# Patient Record
Sex: Male | Born: 1965 | Race: White | Hispanic: No | Marital: Married | Smoking: Never smoker
Health system: Southern US, Community
[De-identification: ages and names within clinical notes are randomized; demographics above are authoritative.]

## PROBLEM LIST (undated history)

## (undated) DIAGNOSIS — E041 Nontoxic single thyroid nodule: Secondary | ICD-10-CM

## (undated) DIAGNOSIS — F329 Major depressive disorder, single episode, unspecified: Secondary | ICD-10-CM

## (undated) DIAGNOSIS — I10 Essential (primary) hypertension: Secondary | ICD-10-CM

## (undated) DIAGNOSIS — I7090 Unspecified atherosclerosis: Secondary | ICD-10-CM

## (undated) DIAGNOSIS — F32A Depression, unspecified: Secondary | ICD-10-CM

## (undated) DIAGNOSIS — C801 Malignant (primary) neoplasm, unspecified: Secondary | ICD-10-CM

## (undated) DIAGNOSIS — J329 Chronic sinusitis, unspecified: Secondary | ICD-10-CM

## (undated) DIAGNOSIS — G4730 Sleep apnea, unspecified: Secondary | ICD-10-CM

## (undated) DIAGNOSIS — C3490 Malignant neoplasm of unspecified part of unspecified bronchus or lung: Secondary | ICD-10-CM

## (undated) DIAGNOSIS — Z8709 Personal history of other diseases of the respiratory system: Secondary | ICD-10-CM

## (undated) DIAGNOSIS — Z8739 Personal history of other diseases of the musculoskeletal system and connective tissue: Secondary | ICD-10-CM

## (undated) DIAGNOSIS — R0602 Shortness of breath: Secondary | ICD-10-CM

## (undated) DIAGNOSIS — E785 Hyperlipidemia, unspecified: Secondary | ICD-10-CM

## (undated) DIAGNOSIS — K219 Gastro-esophageal reflux disease without esophagitis: Secondary | ICD-10-CM

## (undated) DIAGNOSIS — I2699 Other pulmonary embolism without acute cor pulmonale: Secondary | ICD-10-CM

## (undated) DIAGNOSIS — K635 Polyp of colon: Secondary | ICD-10-CM

## (undated) HISTORY — DX: Hyperlipidemia, unspecified: E78.5

## (undated) HISTORY — PX: NASAL SEPTUM SURGERY: SHX37

## (undated) HISTORY — DX: Nontoxic single thyroid nodule: E04.1

## (undated) HISTORY — PX: ESOPHAGOGASTRODUODENOSCOPY: SHX1529

## (undated) HISTORY — PX: COLONOSCOPY: SHX174

## (undated) HISTORY — DX: Polyp of colon: K63.5

## (undated) HISTORY — DX: Sleep apnea, unspecified: G47.30

---
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
## 2004-06-30 ENCOUNTER — Ambulatory Visit: Payer: Self-pay | Admitting: Internal Medicine

## 2005-01-13 ENCOUNTER — Ambulatory Visit: Payer: Self-pay | Admitting: Unknown Physician Specialty

## 2005-12-23 ENCOUNTER — Ambulatory Visit: Payer: Self-pay | Admitting: Unknown Physician Specialty

## 2006-03-18 ENCOUNTER — Ambulatory Visit: Payer: Self-pay | Admitting: Otolaryngology

## 2006-06-21 ENCOUNTER — Ambulatory Visit: Payer: Self-pay | Admitting: Otolaryngology

## 2006-07-20 ENCOUNTER — Ambulatory Visit: Payer: Self-pay | Admitting: Gastroenterology

## 2007-05-28 ENCOUNTER — Emergency Department: Payer: Self-pay | Admitting: Emergency Medicine

## 2007-05-28 ENCOUNTER — Other Ambulatory Visit: Payer: Self-pay

## 2007-06-07 ENCOUNTER — Ambulatory Visit: Payer: Self-pay | Admitting: Cardiology

## 2007-11-19 HISTORY — PX: CARDIAC CATHETERIZATION: SHX172

## 2008-08-31 ENCOUNTER — Ambulatory Visit: Payer: Self-pay | Admitting: Family Medicine

## 2009-11-12 ENCOUNTER — Ambulatory Visit: Payer: Self-pay | Admitting: Gastroenterology

## 2009-12-10 ENCOUNTER — Ambulatory Visit: Payer: Self-pay | Admitting: Family Medicine

## 2010-04-20 HISTORY — PX: MENISCUS REPAIR: SHX5179

## 2010-07-29 ENCOUNTER — Ambulatory Visit: Payer: Self-pay | Admitting: Sports Medicine

## 2011-02-24 ENCOUNTER — Ambulatory Visit: Payer: Self-pay | Admitting: Otolaryngology

## 2011-06-16 ENCOUNTER — Ambulatory Visit: Payer: Self-pay | Admitting: Otolaryngology

## 2011-10-28 ENCOUNTER — Ambulatory Visit: Payer: Self-pay | Admitting: Internal Medicine

## 2011-10-29 ENCOUNTER — Ambulatory Visit (INDEPENDENT_AMBULATORY_CARE_PROVIDER_SITE_OTHER): Payer: BC Managed Care – PPO | Admitting: Internal Medicine

## 2011-10-29 ENCOUNTER — Encounter: Payer: Self-pay | Admitting: Internal Medicine

## 2011-10-29 VITALS — BP 122/82 | HR 56 | Temp 98.7°F | Ht 73.0 in | Wt 237.5 lb

## 2011-10-29 DIAGNOSIS — E669 Obesity, unspecified: Secondary | ICD-10-CM | POA: Insufficient documentation

## 2011-10-29 DIAGNOSIS — A6000 Herpesviral infection of urogenital system, unspecified: Secondary | ICD-10-CM | POA: Insufficient documentation

## 2011-10-29 DIAGNOSIS — E785 Hyperlipidemia, unspecified: Secondary | ICD-10-CM | POA: Insufficient documentation

## 2011-10-29 DIAGNOSIS — Z0000 Encounter for general adult medical examination without abnormal findings: Secondary | ICD-10-CM

## 2011-10-29 MED ORDER — VALACYCLOVIR HCL 1 G PO TABS: 1000.0000 mg | ORAL_TABLET | Freq: Two times a day (BID) | ORAL | Status: DC

## 2011-10-29 NOTE — Assessment & Plan Note (Signed)
Will check lipids and LFTs with labs today. Continue Crestor. 

## 2011-10-29 NOTE — Progress Notes (Signed)
Subjective:    Patient ID: Robert Morse, male    DOB: Dec 09, 1965, 46 y.o.   MRN: [id number]  HPI 46 year old male with history of hyperlipidemia presents to establish care. He reports that he is generally feeling well. He notes that he is compliant with his medications. He recently has adopted a very healthy diet and has been eating foods which are high in lean protein and low in processed carbohydrates. He has been eating 5 meals per day. He is also been exercising regularly, doing cardio 30 minutes 5 days a week and working with a trainer 2-3 times per week. He notes he has lost 10 pounds in the last 3 weeks.  He notes a history of genital herpes. He was first diagnosed with this approximately 5 years ago after his wife was diagnosed with genital herpes. He occasionally takes Valtrex for an outbreak. He estimates this occurs approximately 3 times per year. He has not had a recent outbreak.  Outpatient Encounter Prescriptions as of 10/29/2011  Medication Sig Dispense Refill  . aspirin 81 MG tablet Take 81 mg by mouth daily.      . citalopram (CELEXA) 20 MG tablet Take 20 mg by mouth daily.      . lansoprazole (PREVACID) 30 MG capsule Take 30 mg by mouth daily.      . Multiple Vitamins-Minerals (CENTRUM PO) Take 1 capsule by mouth daily.      . rosuvastatin (CRESTOR) 10 MG tablet Take 10 mg by mouth daily.      . valACYclovir (VALTREX) 1000 MG tablet Take 1 tablet (1,000 mg total) by mouth 2 (two) times daily.  20 tablet  3    Review of Systems  Constitutional: Negative for fever, chills, activity change, appetite change, fatigue and unexpected weight change.  Eyes: Negative for visual disturbance.  Respiratory: Negative for cough and shortness of breath.   Cardiovascular: Negative for chest pain, palpitations and leg swelling.  Gastrointestinal: Negative for abdominal pain and abdominal distention.  Genitourinary: Negative for dysuria, urgency and difficulty urinating.  Musculoskeletal:  Negative for arthralgias and gait problem.  Skin: Negative for color change and rash.  Hematological: Negative for adenopathy.  Psychiatric/Behavioral: Negative for disturbed wake/sleep cycle and dysphoric mood. The patient is not nervous/anxious.        Objective:   Physical Exam  Constitutional: He is oriented to person, place, and time. He appears well-developed and well-nourished. No distress.  HENT:  Head: Normocephalic and atraumatic.  Right Ear: External ear normal.  Left Ear: External ear normal.  Nose: Nose normal.  Mouth/Throat: Oropharynx is clear and moist. No oropharyngeal exudate.  Eyes: Conjunctivae and EOM are normal. Pupils are equal, round, and reactive to light. Right eye exhibits no discharge. Left eye exhibits no discharge. No scleral icterus.  Neck: Normal range of motion. Neck supple. No tracheal deviation present. No thyromegaly present.  Cardiovascular: Normal rate, regular rhythm and normal heart sounds.  Exam reveals no gallop and no friction rub.   No murmur heard. Pulmonary/Chest: Effort normal and breath sounds normal. No respiratory distress. He has no wheezes. He has no rales. He exhibits no tenderness.  Abdominal: Soft. Bowel sounds are normal. He exhibits no distension and no mass. There is no tenderness. There is no guarding.  Musculoskeletal: Normal range of motion. He exhibits no edema.  Lymphadenopathy:    He has no cervical adenopathy.  Neurological: He is alert and oriented to person, place, and time. No cranial nerve deficit. Coordination normal.  Skin: Skin  is warm and dry. No rash noted. He is not diaphoretic. No erythema. No pallor.  Psychiatric: He has a normal mood and affect. His behavior is normal. Judgment and thought content normal.          Assessment & Plan:

## 2011-10-29 NOTE — Assessment & Plan Note (Signed)
Will use Valtrex as needed for outbreaks.

## 2011-10-29 NOTE — Assessment & Plan Note (Addendum)
BMI 31. Encourage continued efforts at healthy diet and regular physical activity. Encouraged goal of 5 hours of exercise per week.

## 2011-10-30 LAB — COMPREHENSIVE METABOLIC PANEL
Alkaline Phosphatase: 43 U/L (ref 39–117)
Creatinine, Ser: 1 mg/dL (ref 0.4–1.5)
Glucose, Bld: 87 mg/dL (ref 70–99)
Sodium: 140 mEq/L (ref 135–145)
Total Bilirubin: 0.6 mg/dL (ref 0.3–1.2)
Total Protein: 7.5 g/dL (ref 6.0–8.3)

## 2011-10-30 LAB — LIPID PANEL
Cholesterol: 157 mg/dL (ref 0–200)
HDL: 55.7 mg/dL (ref >39.00)
LDL Cholesterol: 85 mg/dL (ref 0–99)
Triglycerides: 82 mg/dL (ref 0.0–149.0)
VLDL: 16.4 mg/dL (ref 0.0–40.0)

## 2011-10-30 LAB — CBC WITH DIFFERENTIAL/PLATELET
Basophils Absolute: 0.1 10*3/uL (ref 0.0–0.1)
Eosinophils Absolute: 0.5 10*3/uL (ref 0.0–0.7)
Lymphocytes Relative: 29.6 % (ref 12.0–46.0)
MCHC: 33.8 g/dL (ref 30.0–36.0)
MCV: 90.3 fl (ref 78.0–100.0)
Monocytes Absolute: 0.4 10*3/uL (ref 0.1–1.0)
Neutrophils Relative %: 52.9 % (ref 43.0–77.0)
Platelets: 263 10*3/uL (ref 150.0–400.0)
RBC: 4.75 Mil/uL (ref 4.22–5.81)
RDW: 12.8 % (ref 11.5–14.6)

## 2011-12-18 ENCOUNTER — Encounter: Payer: Self-pay | Admitting: Internal Medicine

## 2011-12-18 ENCOUNTER — Ambulatory Visit (INDEPENDENT_AMBULATORY_CARE_PROVIDER_SITE_OTHER): Payer: BC Managed Care – PPO | Admitting: Internal Medicine

## 2011-12-18 VITALS — BP 140/90 | HR 70 | Temp 98.2°F | Ht 73.0 in | Wt 227.5 lb

## 2011-12-18 DIAGNOSIS — Z0000 Encounter for general adult medical examination without abnormal findings: Secondary | ICD-10-CM

## 2011-12-21 NOTE — Progress Notes (Signed)
Patient ID: Robert Morse, male   DOB: 12/20/1965, 46 y.o.   MRN: [id number] Appointment was cancelled

## 2011-12-28 ENCOUNTER — Telehealth: Payer: Self-pay | Admitting: Internal Medicine

## 2011-12-28 ENCOUNTER — Ambulatory Visit (INDEPENDENT_AMBULATORY_CARE_PROVIDER_SITE_OTHER): Payer: BC Managed Care – PPO | Admitting: Internal Medicine

## 2011-12-28 ENCOUNTER — Encounter: Payer: Self-pay | Admitting: Internal Medicine

## 2011-12-28 VITALS — BP 120/90 | HR 81 | Temp 98.7°F | Ht 73.0 in | Wt 226.8 lb

## 2011-12-28 DIAGNOSIS — M109 Gout, unspecified: Secondary | ICD-10-CM

## 2011-12-28 MED ORDER — PREDNISONE (PAK) 10 MG PO TABS: ORAL_TABLET | ORAL | Status: AC

## 2011-12-28 NOTE — Assessment & Plan Note (Signed)
Symptoms are most consistent with gout flare. Will check uric acid and electrolytes with labs today. Will use ibuprofen over the next couple of days, given that symptoms are improving. However, if any exacerbation of symptoms will start prednisone taper. We also discussed using colchicine if needed. Patient will call if symptoms are worsening.

## 2011-12-28 NOTE — Telephone Encounter (Signed)
We could see him at 11:30 on Wednesday, but this is probably too late for him? If too late, will need to be seen at urgent care if symptoms severe. (overbooked today)

## 2011-12-28 NOTE — Patient Instructions (Signed)
Start taking Ibuprofen 800mg  up to three times daily as needed. If symptoms worsening, then stop ibuprofen and start prednisone.  Gout Gout is an inflammatory condition (arthritis) caused by a buildup of uric acid crystals in the joints. Uric acid is a chemical that is normally present in the blood. Under some circumstances, uric acid can form into crystals in your joints. This causes joint redness, soreness, and swelling (inflammation). Repeat attacks are common. Over time, uric acid crystals can form into masses (tophi) near a joint, causing disfigurement. Gout is treatable and often preventable. CAUSES  The disease begins with elevated levels of uric acid in the blood. Uric acid is produced by your body when it breaks down a naturally found substance called purines. This also happens when you eat certain foods such as meats and fish. Causes of an elevated uric acid level include:  Being passed down from parent to child (heredity).   Diseases that cause increased uric acid production (obesity, psoriasis, some cancers).   Excessive alcohol use.   Diet, especially diets rich in meat and seafood.   Medicines, including certain cancer-fighting drugs (chemotherapy), diuretics, and aspirin.   Chronic kidney disease. The kidneys are no longer able to remove uric acid well.   Problems with metabolism.  Conditions strongly associated with gout include:  Obesity.   High blood pressure.   High cholesterol.   Diabetes.  Not everyone with elevated uric acid levels gets gout. It is not understood why some people get gout and others do not. Surgery, joint injury, and eating too much of certain foods are some of the factors that can lead to gout. SYMPTOMS   An attack of gout comes on quickly. It causes intense pain with redness, swelling, and warmth in a joint.   Fever can occur.   Often, only one joint is involved. Certain joints are more commonly involved:   Base of the big toe.   Knee.     Ankle.   Wrist.   Finger.  Without treatment, an attack usually goes away in a few days to weeks. Between attacks, you usually will not have symptoms, which is different from many other forms of arthritis. DIAGNOSIS  Your caregiver will suspect gout based on your symptoms and exam. Removal of fluid from the joint (arthrocentesis) is done to check for uric acid crystals. Your caregiver will give you a medicine that numbs the area (local anesthetic) and use a needle to remove joint fluid for exam. Gout is confirmed when uric acid crystals are seen in joint fluid, using a special microscope. Sometimes, blood, urine, and X-ray tests are also used. TREATMENT  There are 2 phases to gout treatment: treating the sudden onset (acute) attack and preventing attacks (prophylaxis). Treatment of an Acute Attack  Medicines are used. These include anti-inflammatory medicines or steroid medicines.   An injection of steroid medicine into the affected joint is sometimes necessary.   The painful joint is rested. Movement can worsen the arthritis.   You may use warm or cold treatments on painful joints, depending which works best for you.   Discuss the use of coffee, vitamin C, or cherries with your caregiver. These may be helpful treatment options.  Treatment to Prevent Attacks After the acute attack subsides, your caregiver may advise prophylactic medicine. These medicines either help your kidneys eliminate uric acid from your body or decrease your uric acid production. You may need to stay on these medicines for a very long time. The early phase of  treatment with prophylactic medicine can be associated with an increase in acute gout attacks. For this reason, during the first few months of treatment, your caregiver may also advise you to take medicines usually used for acute gout treatment. Be sure you understand your caregiver's directions. You should also discuss dietary treatment with your caregiver.  Certain foods such as meats and fish can increase uric acid levels. Other foods such as dairy can decrease levels. Your caregiver can give you a list of foods to avoid. HOME CARE INSTRUCTIONS   Do not take aspirin to relieve pain. This raises uric acid levels.   Only take over-the-counter or prescription medicines for pain, discomfort, or fever as directed by your caregiver.   Rest the joint as much as possible. When in bed, keep sheets and blankets off painful areas.   Keep the affected joint raised (elevated).   Use crutches if the painful joint is in your leg.   Drink enough water and fluids to keep your urine clear or pale yellow. This helps your body get rid of uric acid. Do not drink alcoholic beverages. They slow the passage of uric acid.   Follow your caregiver's dietary instructions. Pay careful attention to the amount of protein you eat. Your daily diet should emphasize fruits, vegetables, whole grains, and fat-free or low-fat milk products.   Maintain a healthy body weight.  SEEK MEDICAL CARE IF:   You have an oral temperature above 102 F (38.9 C).   You develop diarrhea, vomiting, or any side effects from medicines.   You do not feel better in 24 hours, or you are getting worse.  SEEK IMMEDIATE MEDICAL CARE IF:   Your joint becomes suddenly more tender and you have:   Chills.   An oral temperature above 102 F (38.9 C), not controlled by medicine.  MAKE SURE YOU:   Understand these instructions.   Will watch your condition.   Will get help right away if you are not doing well or get worse.  Document Released: 04/03/2000 Document Revised: 03/26/2011 Document Reviewed: 07/15/2009 [facility] Patient Information 2012 [place], [place].

## 2011-12-28 NOTE — Telephone Encounter (Signed)
Patient worked in today, ok by Dr. Dan Humphreys.

## 2011-12-28 NOTE — Telephone Encounter (Signed)
Caller: [name]/Patient; Patient Name: Robert Morse; PCP: Ronna Polio (Adults only); Best Callback Phone Number: [phone]; Call regarding Toe Pain, onset 9-8.   Toe is red, swollen and tender to touch, socks makes pain worse.  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  Foot Non-injury Protocol used, see in 4 hours.  No appointments available, please call Patient back if able to fit in due to signs of localized infection that has not improved over 24 hours.  Patient verbalized understanding.

## 2011-12-28 NOTE — Progress Notes (Signed)
  Subjective:    Patient ID: Robert Morse, male    DOB: 03/17/1966, 46 y.o.   MRN: [id number]  HPI 46 year old male presents for acute visit complaining of severe pain in his left second toe which started acutely after playing golf this weekend. He denies any trauma to his toe. He reports that pain started after his round of golf and gradually worsened over the course of the night. Last night, he was unable to sleep or to place a sheet over his foot because of severe pain with minimal palpation of his left second toe. He reports some redness over the toe but denies any fever or chills. He denies any personal history of gout but notes that his father has a history of gout. He reports that pain seemed to have improved this morning without any intervention. However, the toe is still red with some tenderness to palpation.  Outpatient Encounter Prescriptions as of 12/28/2011  Medication Sig Dispense Refill  . aspirin 81 MG tablet Take 81 mg by mouth daily.      . citalopram (CELEXA) 20 MG tablet Take 20 mg by mouth daily.      . lansoprazole (PREVACID) 30 MG capsule Take 30 mg by mouth daily.      . Multiple Vitamins-Minerals (CENTRUM PO) Take 1 capsule by mouth daily.      . rosuvastatin (CRESTOR) 10 MG tablet Take 10 mg by mouth daily.      . predniSONE (STERAPRED UNI-PAK) 10 MG tablet Take 60mg  day 1 then taper by 10mg  daily  21 tablet  0   BP 120/90  Pulse 81  Temp 98.7 F (37.1 C) (Oral)  Ht 6\' 1"  (1.854 m)  Wt 226 lb 12 oz (102.853 kg)  BMI 29.92 kg/m2  SpO2 97%  Review of Systems  Constitutional: Negative for fever, chills, diaphoresis and fatigue.  Musculoskeletal: Positive for arthralgias.  Skin: Positive for color change.       Objective:   Physical Exam  Constitutional: He appears well-developed and well-nourished. No distress.  HENT:  Head: Normocephalic and atraumatic.  Eyes: EOM are normal. Pupils are equal, round, and reactive to light.  Neck: Normal range of motion.    Pulmonary/Chest: Effort normal.  Musculoskeletal:       Left foot: He exhibits decreased range of motion, tenderness, bony tenderness and swelling.       Feet:  Skin: He is not diaphoretic.          Assessment & Plan:

## 2011-12-29 LAB — COMPREHENSIVE METABOLIC PANEL WITH GFR
ALT: 49 U/L (ref 0–53)
AST: 25 U/L (ref 0–37)
Albumin: 4.5 g/dL (ref 3.5–5.2)
Alkaline Phosphatase: 55 U/L (ref 39–117)
BUN: 15 mg/dL (ref 6–23)
CO2: 27 meq/L (ref 19–32)
Calcium: 9.4 mg/dL (ref 8.4–10.5)
Chloride: 99 meq/L (ref 96–112)
Creatinine, Ser: 0.9 mg/dL (ref 0.4–1.5)
GFR: 95.09 mL/min
Glucose, Bld: 137 mg/dL — ABNORMAL HIGH (ref 70–99)
Potassium: 3.6 meq/L (ref 3.5–5.1)
Sodium: 136 meq/L (ref 135–145)
Total Bilirubin: 0.7 mg/dL (ref 0.3–1.2)
Total Protein: 7.6 g/dL (ref 6.0–8.3)

## 2011-12-29 LAB — URIC ACID: Uric Acid, Serum: 6.4 mg/dL (ref 4.0–7.8)

## 2012-02-02 ENCOUNTER — Telehealth: Payer: Self-pay | Admitting: Internal Medicine

## 2012-02-02 MED ORDER — LANSOPRAZOLE 30 MG PO CPDR: 30.0000 mg | DELAYED_RELEASE_CAPSULE | Freq: Every day | ORAL | Status: DC

## 2012-02-02 MED ORDER — CITALOPRAM HYDROBROMIDE 20 MG PO TABS: 20.0000 mg | ORAL_TABLET | Freq: Every day | ORAL | Status: DC

## 2012-02-02 MED ORDER — ROSUVASTATIN CALCIUM 10 MG PO TABS: 10.0000 mg | ORAL_TABLET | Freq: Every day | ORAL | Status: DC

## 2012-02-02 NOTE — Telephone Encounter (Signed)
Spoke with patient and advised results rx sent to pharmacy by e-script  

## 2012-02-02 NOTE — Telephone Encounter (Signed)
Pt came in today needing refills on his meds Citalopram hbr 20mg   Pt out of this med Rx# [id number]  [address]  Lansoprazole dr 30mg   Rx# [id number]  [facility]  crestor 10mg  Rx# [id number]  [facility] [address]

## 2012-02-23 ENCOUNTER — Ambulatory Visit (INDEPENDENT_AMBULATORY_CARE_PROVIDER_SITE_OTHER): Payer: BC Managed Care – PPO | Admitting: *Deleted

## 2012-02-23 DIAGNOSIS — Z23 Encounter for immunization: Secondary | ICD-10-CM

## 2012-02-23 MED ORDER — TETANUS-DIPHTH-ACELL PERTUSSIS 5-2.5-18.5 LF-MCG/0.5 IM SUSP: 0.5000 mL | Freq: Once | INTRAMUSCULAR | Status: DC

## 2012-03-31 ENCOUNTER — Ambulatory Visit (INDEPENDENT_AMBULATORY_CARE_PROVIDER_SITE_OTHER): Payer: BC Managed Care – PPO | Admitting: Internal Medicine

## 2012-03-31 ENCOUNTER — Encounter: Payer: Self-pay | Admitting: Internal Medicine

## 2012-03-31 VITALS — BP 118/80 | HR 64 | Temp 98.2°F | Resp 16 | Ht 73.0 in | Wt 233.2 lb

## 2012-03-31 DIAGNOSIS — Z0000 Encounter for general adult medical examination without abnormal findings: Secondary | ICD-10-CM

## 2012-03-31 DIAGNOSIS — E669 Obesity, unspecified: Secondary | ICD-10-CM

## 2012-03-31 DIAGNOSIS — Z1331 Encounter for screening for depression: Secondary | ICD-10-CM

## 2012-03-31 DIAGNOSIS — E785 Hyperlipidemia, unspecified: Secondary | ICD-10-CM

## 2012-03-31 DIAGNOSIS — J0100 Acute maxillary sinusitis, unspecified: Secondary | ICD-10-CM

## 2012-03-31 LAB — COMPREHENSIVE METABOLIC PANEL
ALT: 34 U/L (ref 0–53)
AST: 26 U/L (ref 0–37)
Albumin: 4.4 g/dL (ref 3.5–5.2)
BUN: 15 mg/dL (ref 6–23)
CO2: 27 mEq/L (ref 19–32)
Calcium: 9.4 mg/dL (ref 8.4–10.5)
Chloride: 101 mEq/L (ref 96–112)
GFR: 97.46 mL/min (ref >60.00)
Potassium: 4.1 mEq/L (ref 3.5–5.1)

## 2012-03-31 LAB — CBC WITH DIFFERENTIAL/PLATELET
Basophils Absolute: 0.1 10*3/uL (ref 0.0–0.1)
Eosinophils Absolute: 0.6 10*3/uL (ref 0.0–0.7)
Lymphocytes Relative: 27.7 % (ref 12.0–46.0)
MCHC: 34.8 g/dL (ref 30.0–36.0)
Monocytes Relative: 6.5 % (ref 3.0–12.0)
Neutrophils Relative %: 53.7 % (ref 43.0–77.0)
RBC: 4.96 Mil/uL (ref 4.22–5.81)
RDW: 13.3 % (ref 11.5–14.6)

## 2012-03-31 LAB — PSA, TOTAL AND FREE
PSA, Free: 0.18 ng/mL
PSA: 0.62 ng/mL (ref <=4.00)

## 2012-03-31 LAB — LDL CHOLESTEROL, DIRECT: Direct LDL: 139.6 mg/dL

## 2012-03-31 LAB — LIPID PANEL
Cholesterol: 209 mg/dL — ABNORMAL HIGH (ref 0–200)
VLDL: 15 mg/dL (ref 0.0–40.0)

## 2012-03-31 MED ORDER — PREDNISONE (PAK) 10 MG PO TABS: ORAL_TABLET | ORAL | Status: DC

## 2012-03-31 MED ORDER — ROSUVASTATIN CALCIUM 20 MG PO TABS: 20.0000 mg | ORAL_TABLET | Freq: Every day | ORAL | Status: DC

## 2012-03-31 MED ORDER — AMOXICILLIN-POT CLAVULANATE 875-125 MG PO TABS: 1.0000 | ORAL_TABLET | Freq: Two times a day (BID) | ORAL | Status: DC

## 2012-03-31 MED ORDER — FLUTICASONE PROPIONATE 50 MCG/ACT NA SUSP: 2.0000 | Freq: Every day | NASAL | Status: DC

## 2012-03-31 MED ORDER — GENTAMICIN SULFATE 0.1 % EX OINT: TOPICAL_OINTMENT | Freq: Three times a day (TID) | CUTANEOUS | Status: DC

## 2012-03-31 NOTE — Assessment & Plan Note (Signed)
Symptoms consistent with bilateral acute maxillary sinusitis. Will treat with Augmentin and prednisone taper. Will start Flonase. Will use topical gentamicin to scratch within the right nares to prevent infection. Follow up if symptoms not improving over next 72hr.

## 2012-03-31 NOTE — Progress Notes (Signed)
Subjective:    Patient ID: Robert Morse, male    DOB: 09-12-1965, 46 y.o.   MRN: [id number]  HPI 46 year old male with history of hyperlipidemia presents for annual exam. He is concerned today about a one-week history of sinus congestion, bilateral sinus pressure, purulent sinus drainage with occasional bloody drainage. He reports repeated episodes of sinus infections in the past. He is status post repair of nasal septal defect. He denies any fever, chills, cough, shortness of breath. He has not been taking any medication for this. Next  In regards to hyperlipidemia, he reports compliance of Crestor. He denies any side effects from this medication. He follows a healthy diet and is generally active. He notes that both his father and brother have a history of early heart disease.  Outpatient Encounter Prescriptions as of 03/31/2012  Medication Sig Dispense Refill  . aspirin 81 MG tablet Take 81 mg by mouth daily.      . citalopram (CELEXA) 20 MG tablet Take 1 tablet (20 mg total) by mouth daily.  90 tablet  0  . lansoprazole (PREVACID) 30 MG capsule Take 1 capsule (30 mg total) by mouth daily.  90 capsule  0  . rosuvastatin (CRESTOR) 10 MG tablet Take 1 tablet (10 mg total) by mouth daily.  90 tablet  0  . amoxicillin-clavulanate (AUGMENTIN) 875-125 MG per tablet Take 1 tablet by mouth 2 (two) times daily.  20 tablet  0  . fluticasone (FLONASE) 50 MCG/ACT nasal spray Place 2 sprays into the nose daily.  16 g  2  . gentamicin ointment (GARAMYCIN) 0.1 % Apply topically 3 (three) times daily.  15 g  0  . Multiple Vitamins-Minerals (CENTRUM PO) Take 1 capsule by mouth daily.      . predniSONE (STERAPRED UNI-PAK) 10 MG tablet Take 60mg  day 1 then taper by 10mg  daily  21 tablet  0    BP 118/80  Pulse 64  Temp 98.2 F (36.8 C) (Oral)  Resp 16  Ht 6\' 1"  (1.854 m)  Wt 233 lb 4 oz (105.802 kg)  BMI 30.77 kg/m2  SpO2 97%  Review of Systems  Constitutional: Negative for fever, chills, activity  change, appetite change, fatigue and unexpected weight change.  HENT: Positive for congestion, rhinorrhea, postnasal drip and sinus pressure. Negative for sore throat and trouble swallowing.   Eyes: Negative for visual disturbance.  Respiratory: Negative for cough and shortness of breath.   Cardiovascular: Negative for chest pain, palpitations and leg swelling.  Gastrointestinal: Negative for abdominal pain and abdominal distention.  Genitourinary: Negative for dysuria, urgency and difficulty urinating.  Musculoskeletal: Negative for arthralgias and gait problem.  Skin: Negative for color change and rash.  Hematological: Negative for adenopathy.  Psychiatric/Behavioral: Negative for sleep disturbance and dysphoric mood. The patient is not nervous/anxious.        Objective:   Physical Exam  Constitutional: He is oriented to person, place, and time. He appears well-developed and well-nourished. No distress.  HENT:  Head: Normocephalic and atraumatic.  Right Ear: External ear normal.  Left Ear: External ear normal.  Nose: Nose normal.  Mouth/Throat: Oropharynx is clear and moist. No oropharyngeal exudate.  Eyes: Conjunctivae normal and EOM are normal. Pupils are equal, round, and reactive to light. Right eye exhibits no discharge. Left eye exhibits no discharge. No scleral icterus.  Neck: Normal range of motion. Neck supple. No tracheal deviation present. No thyromegaly present.  Cardiovascular: Normal rate, regular rhythm and normal heart sounds.  Exam reveals no gallop  and no friction rub.   No murmur heard. Pulmonary/Chest: Effort normal and breath sounds normal. No respiratory distress. He has no wheezes. He has no rales. He exhibits no tenderness.  Musculoskeletal: Normal range of motion. He exhibits no edema.  Lymphadenopathy:    He has no cervical adenopathy.  Neurological: He is alert and oriented to person, place, and time. No cranial nerve deficit. Coordination normal.  Skin:  Skin is warm and dry. No rash noted. He is not diaphoretic. No erythema. No pallor.  Psychiatric: He has a normal mood and affect. His behavior is normal. Judgment and thought content normal.          Assessment & Plan:

## 2012-03-31 NOTE — Assessment & Plan Note (Signed)
Encouraged continued efforts at healthy diet and regular physical activity.

## 2012-03-31 NOTE — Addendum Note (Signed)
Addended by: Jackson Latino on: 03/31/2012 04:31 PM   Modules accepted: Orders

## 2012-03-31 NOTE — Assessment & Plan Note (Addendum)
General medical exam is normal today. Health maintenance is up to date including vaccinations. Encourage continued efforts at healthy diet and regular physical activity. Baseline EKG today normal. Will check labs including CBC, CMP, lipids, LFTs, Vit D, TSH, PSA. Followup in 6 months or sooner as needed.

## 2012-03-31 NOTE — Assessment & Plan Note (Signed)
Will repeat lipids and LFTs with labs today. Continue Crestor. 

## 2012-04-01 LAB — VITAMIN D 25 HYDROXY (VIT D DEFICIENCY, FRACTURES): Vit D, 25-Hydroxy: 39 ng/mL (ref 30–89)

## 2012-05-03 ENCOUNTER — Other Ambulatory Visit: Payer: Self-pay | Admitting: Internal Medicine

## 2012-07-19 ENCOUNTER — Other Ambulatory Visit: Payer: Self-pay | Admitting: Internal Medicine

## 2012-07-19 NOTE — Telephone Encounter (Signed)
Med filled.  

## 2012-09-30 ENCOUNTER — Encounter: Payer: Self-pay | Admitting: Internal Medicine

## 2012-09-30 ENCOUNTER — Ambulatory Visit (INDEPENDENT_AMBULATORY_CARE_PROVIDER_SITE_OTHER): Payer: BC Managed Care – PPO | Admitting: Internal Medicine

## 2012-09-30 VITALS — BP 138/88 | HR 64 | Temp 98.0°F | Wt 241.0 lb

## 2012-09-30 DIAGNOSIS — E785 Hyperlipidemia, unspecified: Secondary | ICD-10-CM

## 2012-09-30 DIAGNOSIS — A6000 Herpesviral infection of urogenital system, unspecified: Secondary | ICD-10-CM

## 2012-09-30 MED ORDER — VALACYCLOVIR HCL 500 MG PO TABS: 500.0000 mg | ORAL_TABLET | Freq: Two times a day (BID) | ORAL | Status: DC

## 2012-09-30 NOTE — Assessment & Plan Note (Signed)
Given that he is having frequent herpes outbreaks, will start suppressive therapy without tracts 500 mg daily. He will call if any recurrent symptoms. If outbreaks continue to be frequent, would favor increasing suppressive therapy dose of Valtrex to 1000 mg daily.

## 2012-09-30 NOTE — Progress Notes (Signed)
  Subjective:    Patient ID: Robert Morse, male    DOB: Apr 14, 1966, 47 y.o.   MRN: [id number]  HPI 47 year old male with history of hyperlipidemia and anxiety presents for followup. He reports he is generally feeling well. His only concern today is frequent episodes of herpes genitalis. He reports that episodes typically occur about every 6 weeks. He takes Valtrex for these episodes with some improvement. However, he is interested in starting suppressive therapy.  Outpatient Encounter Prescriptions as of 09/30/2012  Medication Sig Dispense Refill  . aspirin 81 MG tablet Take 81 mg by mouth daily.      . citalopram (CELEXA) 20 MG tablet TAKE 1 TABLET (20 MG TOTAL) BY MOUTH DAILY.  90 tablet  3  . lansoprazole (PREVACID) 30 MG capsule TAKE 1 CAPSULE (30 MG TOTAL) BY MOUTH DAILY.  90 capsule  0  . rosuvastatin (CRESTOR) 20 MG tablet Take 1 tablet (20 mg total) by mouth daily.  90 tablet  3  . fluticasone (FLONASE) 50 MCG/ACT nasal spray Place 2 sprays into the nose daily.  16 g  2  . Multiple Vitamins-Minerals (CENTRUM PO) Take 1 capsule by mouth daily.      . valACYclovir (VALTREX) 500 MG tablet Take 1 tablet (500 mg total) by mouth 2 (two) times daily.  90 tablet  4   No facility-administered encounter medications on file as of 09/30/2012.    Review of Systems  Constitutional: Negative for fever, chills, activity change, appetite change, fatigue and unexpected weight change.  Eyes: Negative for visual disturbance.  Respiratory: Negative for cough and shortness of breath.   Cardiovascular: Negative for chest pain, palpitations and leg swelling.  Gastrointestinal: Negative for abdominal pain and abdominal distention.  Genitourinary: Negative for dysuria, urgency and difficulty urinating.  Musculoskeletal: Negative for arthralgias and gait problem.  Skin: Negative for color change and rash.  Hematological: Negative for adenopathy.  Psychiatric/Behavioral: Negative for sleep disturbance and  dysphoric mood. The patient is not nervous/anxious.        Objective:   Physical Exam  Constitutional: He is oriented to person, place, and time. He appears well-developed and well-nourished. No distress.  HENT:  Head: Normocephalic and atraumatic.  Right Ear: External ear normal.  Left Ear: External ear normal.  Nose: Nose normal.  Mouth/Throat: Oropharynx is clear and moist. No oropharyngeal exudate.  Eyes: Conjunctivae and EOM are normal. Pupils are equal, round, and reactive to light. Right eye exhibits no discharge. Left eye exhibits no discharge. No scleral icterus.  Neck: Normal range of motion. Neck supple. No tracheal deviation present. No thyromegaly present.  Cardiovascular: Normal rate, regular rhythm and normal heart sounds.  Exam reveals no gallop and no friction rub.   No murmur heard. Pulmonary/Chest: Effort normal and breath sounds normal. No respiratory distress. He has no wheezes. He has no rales. He exhibits no tenderness.  Musculoskeletal: Normal range of motion. He exhibits no edema.  Lymphadenopathy:    He has no cervical adenopathy.  Neurological: He is alert and oriented to person, place, and time. No cranial nerve deficit. Coordination normal.  Skin: Skin is warm and dry. No rash noted. He is not diaphoretic. No erythema. No pallor.  Psychiatric: He has a normal mood and affect. His behavior is normal. Judgment and thought content normal.          Assessment & Plan:

## 2012-09-30 NOTE — Assessment & Plan Note (Signed)
Will check LFTs with labs today. Continue Crestor. Followup 6 months.

## 2012-10-03 ENCOUNTER — Other Ambulatory Visit (INDEPENDENT_AMBULATORY_CARE_PROVIDER_SITE_OTHER): Payer: BC Managed Care – PPO

## 2012-10-03 DIAGNOSIS — E785 Hyperlipidemia, unspecified: Secondary | ICD-10-CM

## 2012-10-03 LAB — COMPREHENSIVE METABOLIC PANEL
Albumin: 4.4 g/dL (ref 3.5–5.2)
Alkaline Phosphatase: 44 U/L (ref 39–117)
BUN: 12 mg/dL (ref 6–23)
CO2: 30 mEq/L (ref 19–32)
Calcium: 10 mg/dL (ref 8.4–10.5)
Chloride: 102 mEq/L (ref 96–112)
GFR: 86 mL/min (ref >60.00)
Glucose, Bld: 99 mg/dL (ref 70–99)
Potassium: 4.7 mEq/L (ref 3.5–5.1)
Sodium: 139 mEq/L (ref 135–145)
Total Protein: 7.3 g/dL (ref 6.0–8.3)

## 2012-10-03 LAB — LIPID PANEL
Cholesterol: 201 mg/dL — ABNORMAL HIGH (ref 0–200)
Total CHOL/HDL Ratio: 3

## 2012-10-28 ENCOUNTER — Telehealth: Payer: Self-pay | Admitting: *Deleted

## 2012-10-28 ENCOUNTER — Other Ambulatory Visit: Payer: Self-pay | Admitting: Internal Medicine

## 2012-10-28 ENCOUNTER — Other Ambulatory Visit: Payer: Self-pay | Admitting: *Deleted

## 2012-10-28 NOTE — Telephone Encounter (Signed)
Pt is coming in for labs Monday 07.14.2014 what labs and dx?

## 2012-10-28 NOTE — Telephone Encounter (Signed)
CMP 790.4 Thanks

## 2012-10-31 ENCOUNTER — Other Ambulatory Visit (INDEPENDENT_AMBULATORY_CARE_PROVIDER_SITE_OTHER): Payer: BC Managed Care – PPO

## 2012-10-31 LAB — COMPREHENSIVE METABOLIC PANEL
AST: 32 U/L (ref 0–37)
Albumin: 4.3 g/dL (ref 3.5–5.2)
BUN: 17 mg/dL (ref 6–23)
CO2: 26 mEq/L (ref 19–32)
Calcium: 9.4 mg/dL (ref 8.4–10.5)
Chloride: 102 mEq/L (ref 96–112)
GFR: 84.98 mL/min (ref >60.00)
Potassium: 4.4 mEq/L (ref 3.5–5.1)

## 2013-01-24 ENCOUNTER — Telehealth: Payer: Self-pay | Admitting: *Deleted

## 2013-01-24 NOTE — Telephone Encounter (Signed)
Called [facility] for prior authorization on the Lansoprazole, received fax from place in [employer]

## 2013-01-31 ENCOUNTER — Telehealth: Payer: Self-pay | Admitting: Internal Medicine

## 2013-01-31 NOTE — Telephone Encounter (Signed)
Pt states he is completely out of lansoprazole.  [facility] [address]  Andrey Cota fx'd request 10/7.

## 2013-02-01 MED ORDER — OMEPRAZOLE 20 MG PO CPDR: 20.0000 mg | DELAYED_RELEASE_CAPSULE | Freq: Every day | ORAL | Status: DC

## 2013-02-01 NOTE — Telephone Encounter (Signed)
Patient called back and stated he wanted [place] to send in prescription to the pharmacy. Rx sent to pharmacy

## 2013-02-01 NOTE — Telephone Encounter (Signed)
Called and spoke with patient, he is not calling because he needs refills. He still has refills left on his medication, the Lansoprazole needs a prior authorization which has already been started by [employer]. However he is completely out of his medication and would like to know if there is anything else he can take until this matter is resolved? Do we have any samples of anything or anything you could suggest he take OTC? Please advise.

## 2013-02-01 NOTE — Telephone Encounter (Signed)
Spoke with patient, he stated he will just try the OTC Prevacid now since he is going out of town. Then once he finds out if the insurance will pay for it then he will decided if he wants to give the Omeprazole a try.

## 2013-02-01 NOTE — Telephone Encounter (Signed)
I have completed the prior authorization, however I doubt it will be approved, as he needs to try formulary agents. I would recommend Omeprazole 20mg  daily unless he has tried this before.

## 2013-02-17 ENCOUNTER — Telehealth: Payer: Self-pay | Admitting: *Deleted

## 2013-02-17 NOTE — Telephone Encounter (Signed)
Patient informed and declined being seen at the [facility] office tomorrow. Stated he would call back after the weekend then let us know if he needs to be seen.

## 2013-02-17 NOTE — Telephone Encounter (Signed)
He needs to be seen prior to starting antibiotics. We have clinic at [facility] tomorrow and can schedule him there if he likes.

## 2013-02-17 NOTE — Telephone Encounter (Signed)
Returned patient call from voicemail message he left on the voicemail. Patient state he has been having some congestion and a non productive with some chest congestion that began last night. Chest began burning and he is afraid by Sunday it will be worse, would like to know if you would call him in some antibiotics. No fever or sore throat.

## 2013-02-20 ENCOUNTER — Encounter: Payer: Self-pay | Admitting: Adult Health

## 2013-02-20 ENCOUNTER — Ambulatory Visit (INDEPENDENT_AMBULATORY_CARE_PROVIDER_SITE_OTHER): Payer: 59 | Admitting: Adult Health

## 2013-02-20 VITALS — BP 122/78 | HR 60 | Temp 98.1°F | Resp 12 | Wt 251.0 lb

## 2013-02-20 DIAGNOSIS — J019 Acute sinusitis, unspecified: Secondary | ICD-10-CM

## 2013-02-20 DIAGNOSIS — J0100 Acute maxillary sinusitis, unspecified: Secondary | ICD-10-CM

## 2013-02-20 MED ORDER — AMOXICILLIN-POT CLAVULANATE 875-125 MG PO TABS: 1.0000 | ORAL_TABLET | Freq: Two times a day (BID) | ORAL | Status: DC

## 2013-02-20 MED ORDER — FLUTICASONE PROPIONATE 50 MCG/ACT NA SUSP: 2.0000 | Freq: Every day | NASAL | Status: DC

## 2013-02-20 NOTE — Progress Notes (Signed)
  Subjective:    Patient ID: Robert Morse, male    DOB: 1965/10/14, 47 y.o.   MRN: [id number]  HPI  Pt is pleasant 47 yo male, reports cold symptoms (runny nose and sneezing) for 2 months.  Pt states he developed productive cough with white phlegm last Thursday with mild dyspnea on exertion.  Denies fevers or chills. Pt reports intermittent voice hoarseness. Pt's wife has had similar sx over the past 2 months as well.     Current Outpatient Prescriptions on File Prior to Visit  Medication Sig Dispense Refill  . aspirin 81 MG tablet Take 81 mg by mouth daily.      . citalopram (CELEXA) 20 MG tablet TAKE 1 TABLET (20 MG TOTAL) BY MOUTH DAILY.  90 tablet  3  . omeprazole (PRILOSEC) 20 MG capsule Take 1 capsule (20 mg total) by mouth daily.  30 capsule  3  . rosuvastatin (CRESTOR) 20 MG tablet Take 1 tablet (20 mg total) by mouth daily.  90 tablet  3  . valACYclovir (VALTREX) 500 MG tablet Take 1 tablet (500 mg total) by mouth 2 (two) times daily.  90 tablet  4   No current facility-administered medications on file prior to visit.     Review of Systems  Constitutional: Negative for fever and chills.  HENT: Positive for congestion, postnasal drip, rhinorrhea, sneezing and voice change. Negative for ear pain and sinus pressure.   Respiratory: Positive for cough and shortness of breath. Negative for chest tightness.        Mild SOB on exertion  Cardiovascular: Negative for chest pain.       Objective:   Physical Exam  Constitutional: He is oriented to person, place, and time. He appears well-developed and well-nourished.  HENT:  Mouth/Throat: Oropharynx is clear and moist.  Neck: Normal range of motion.  Cardiovascular: Normal rate, regular rhythm and normal heart sounds.   Pulmonary/Chest: Effort normal. No respiratory distress. He has no wheezes. He has rhonchi.  Rhonchi, clears with coughing  Lymphadenopathy:    He has no cervical adenopathy.  Neurological: He is alert and  oriented to person, place, and time.  Skin: Skin is warm and dry.  Psychiatric: He has a normal mood and affect. His behavior is normal. Judgment and thought content normal.   BP 122/78  Pulse 60  Temp(Src) 98.1 F (36.7 C) (Oral)  Resp 12  Wt 251 lb (113.853 kg)  SpO2 97%        Assessment & Plan:

## 2013-02-20 NOTE — Assessment & Plan Note (Addendum)
Augmentin twice a day x10 days. Over-the-counter cough medicine. Flonase nasal spray 2 sprays into each nostril daily. RTC if symptoms are not improved within 4-5 days.

## 2013-02-20 NOTE — Patient Instructions (Signed)
  Start Augmentin twice daily for 10 days.  Flonase spray 2 sprays into each nostril daily  Over the counter cough medicine with guaifenesin as needed for cough.  Please call if symptoms are not improvement within 4-5 days.

## 2013-02-23 ENCOUNTER — Other Ambulatory Visit: Payer: Self-pay

## 2013-02-27 ENCOUNTER — Other Ambulatory Visit: Payer: Self-pay | Admitting: *Deleted

## 2013-02-27 MED ORDER — MELOXICAM 15 MG PO TABS: 15.0000 mg | ORAL_TABLET | Freq: Every day | ORAL | Status: DC

## 2013-03-01 ENCOUNTER — Ambulatory Visit (INDEPENDENT_AMBULATORY_CARE_PROVIDER_SITE_OTHER): Payer: 59 | Admitting: Adult Health

## 2013-03-01 ENCOUNTER — Encounter: Payer: Self-pay | Admitting: Adult Health

## 2013-03-01 VITALS — BP 126/72 | HR 83 | Resp 14 | Wt 250.0 lb

## 2013-03-01 DIAGNOSIS — R0602 Shortness of breath: Secondary | ICD-10-CM

## 2013-03-01 NOTE — Progress Notes (Signed)
  Subjective:    Patient ID: Robert Morse, male    DOB: 10-14-1965, 47 y.o.   MRN: [id number]  HPI  Pt is a pleasant 47 yo male, presents to clinic with shortness of breath on exertion for 6 months, worse over the past few weeks. Pt was recently seen and treated for sinusitis and had SOB then but thought it was related to sinusitis, but states all other symptoms have resolved except for shortness of breath.  Pt states even walking to the car or playing golf causes him to get winded.  Pt reports significant family history of heart disease and is concerned this is related to his heart. Pt also reports a 20 lb weight gain over the past year and a half and has not been exercising regularly for the past year and is aware this may be causing his symptoms but is now apprehensive about starting an exercise program without having his heart checked first. He denies syncope or near syncopal events with shortness of breath. He denies chest pain pain with inspiration.  Current Outpatient Prescriptions on File Prior to Visit  Medication Sig Dispense Refill  . amoxicillin-clavulanate (AUGMENTIN) 875-125 MG per tablet Take 1 tablet by mouth 2 (two) times daily.  20 tablet  0  . citalopram (CELEXA) 20 MG tablet TAKE 1 TABLET (20 MG TOTAL) BY MOUTH DAILY.  90 tablet  3  . fluticasone (FLONASE) 50 MCG/ACT nasal spray Place 2 sprays into the nose daily.  16 g  3  . meloxicam (MOBIC) 15 MG tablet Take 1 tablet (15 mg total) by mouth daily.  30 tablet  3  . omeprazole (PRILOSEC) 20 MG capsule Take 1 capsule (20 mg total) by mouth daily.  30 capsule  3  . rosuvastatin (CRESTOR) 20 MG tablet Take 1 tablet (20 mg total) by mouth daily.  90 tablet  3  . valACYclovir (VALTREX) 500 MG tablet Take 1 tablet (500 mg total) by mouth 2 (two) times daily.  90 tablet  4  . aspirin 81 MG tablet Take 81 mg by mouth daily.       No current facility-administered medications on file prior to visit.     Review of Systems   Respiratory: Positive for shortness of breath. Negative for cough, chest tightness and wheezing.        Shortness of breath with exertion  Cardiovascular: Negative for chest pain and palpitations.       Objective:   Physical Exam  Constitutional: He is oriented to person, place, and time.  Pleasant male in no apparent distress  Cardiovascular: Normal rate, regular rhythm and normal heart sounds.  Exam reveals no gallop.   No murmur heard. Pulmonary/Chest: Effort normal and breath sounds normal. No respiratory distress.  Neurological: He is alert and oriented to person, place, and time. He has normal reflexes.  Psychiatric: He has a normal mood and affect. His behavior is normal. Judgment and thought content normal.          Assessment & Plan:

## 2013-03-01 NOTE — Assessment & Plan Note (Signed)
EKG shows no acute abnormalities. No ischemia. Chest x-ray to rule out possible walking pneumonia given recent episode of respiratory infection. I will also order an echo. Consider referral to cardiology for stress test.

## 2013-03-01 NOTE — Patient Instructions (Addendum)
  Please go to our [facility] at your earliest convenience for your chest xray.  I am also ordering an Echo (ultrasound of your heart)  Once we obtain the results we will let you know.

## 2013-03-03 ENCOUNTER — Other Ambulatory Visit: Payer: Self-pay | Admitting: Adult Health

## 2013-03-03 ENCOUNTER — Ambulatory Visit (INDEPENDENT_AMBULATORY_CARE_PROVIDER_SITE_OTHER)
Admission: RE | Admit: 2013-03-03 | Discharge: 2013-03-03 | Disposition: A | Discharge status: Home / Self Care | Payer: 59 | Source: Ambulatory Visit | Attending: Adult Health | Admitting: Adult Health

## 2013-03-03 DIAGNOSIS — R0602 Shortness of breath: Secondary | ICD-10-CM

## 2013-03-03 MED ORDER — LEVOFLOXACIN 750 MG PO TABS: 750.0000 mg | ORAL_TABLET | Freq: Every day | ORAL | Status: DC

## 2013-03-03 MED ORDER — ALBUTEROL SULFATE HFA 108 (90 BASE) MCG/ACT IN AERS: 2.0000 | INHALATION_SPRAY | Freq: Four times a day (QID) | RESPIRATORY_TRACT | Status: DC | PRN

## 2013-03-06 ENCOUNTER — Encounter: Payer: Self-pay | Admitting: Adult Health

## 2013-03-10 ENCOUNTER — Other Ambulatory Visit: Payer: Self-pay

## 2013-03-10 ENCOUNTER — Encounter: Payer: Self-pay | Admitting: Adult Health

## 2013-03-10 ENCOUNTER — Other Ambulatory Visit (INDEPENDENT_AMBULATORY_CARE_PROVIDER_SITE_OTHER): Payer: 59

## 2013-03-10 DIAGNOSIS — R0602 Shortness of breath: Secondary | ICD-10-CM

## 2013-03-13 ENCOUNTER — Other Ambulatory Visit: Payer: Self-pay | Admitting: Adult Health

## 2013-03-13 ENCOUNTER — Encounter: Payer: Self-pay | Admitting: Adult Health

## 2013-03-13 DIAGNOSIS — R9389 Abnormal findings on diagnostic imaging of other specified body structures: Secondary | ICD-10-CM

## 2013-03-27 ENCOUNTER — Other Ambulatory Visit: Payer: Self-pay | Admitting: Internal Medicine

## 2013-04-03 ENCOUNTER — Encounter: Payer: BC Managed Care – PPO | Admitting: Internal Medicine

## 2013-04-17 ENCOUNTER — Other Ambulatory Visit: Payer: Self-pay | Admitting: Adult Health

## 2013-04-17 ENCOUNTER — Telehealth: Payer: Self-pay | Admitting: *Deleted

## 2013-04-17 ENCOUNTER — Ambulatory Visit (INDEPENDENT_AMBULATORY_CARE_PROVIDER_SITE_OTHER)
Admission: RE | Admit: 2013-04-17 | Discharge: 2013-04-17 | Disposition: A | Discharge status: Home / Self Care | Payer: 59 | Source: Ambulatory Visit | Attending: Adult Health | Admitting: Adult Health

## 2013-04-17 DIAGNOSIS — R9389 Abnormal findings on diagnostic imaging of other specified body structures: Secondary | ICD-10-CM

## 2013-04-17 DIAGNOSIS — R918 Other nonspecific abnormal finding of lung field: Secondary | ICD-10-CM

## 2013-04-17 NOTE — Telephone Encounter (Signed)
CT ordered. 

## 2013-04-17 NOTE — Progress Notes (Signed)
CT chest ordered to follow up on right perihilar opacity

## 2013-04-17 NOTE — Telephone Encounter (Signed)
[facility] Radiology called report on CXR, report available in EPIC. Recommend followup CT

## 2013-04-18 NOTE — Progress Notes (Signed)
Pt notified by [facility] of CT appointment

## 2013-04-21 ENCOUNTER — Ambulatory Visit: Payer: Self-pay | Admitting: Adult Health

## 2013-04-21 ENCOUNTER — Encounter: Payer: 59 | Admitting: Internal Medicine

## 2013-04-24 ENCOUNTER — Ambulatory Visit (INDEPENDENT_AMBULATORY_CARE_PROVIDER_SITE_OTHER): Payer: 59 | Admitting: Internal Medicine

## 2013-04-24 ENCOUNTER — Telehealth: Payer: Self-pay

## 2013-04-24 ENCOUNTER — Encounter: Payer: Self-pay | Admitting: Internal Medicine

## 2013-04-24 VITALS — BP 140/90 | HR 98 | Temp 98.1°F | Wt 254.0 lb

## 2013-04-24 DIAGNOSIS — R918 Other nonspecific abnormal finding of lung field: Secondary | ICD-10-CM

## 2013-04-24 DIAGNOSIS — R0602 Shortness of breath: Secondary | ICD-10-CM

## 2013-04-24 DIAGNOSIS — R222 Localized swelling, mass and lump, trunk: Secondary | ICD-10-CM

## 2013-04-24 NOTE — Progress Notes (Signed)
Subjective:    Patient ID: Robert Morse, male    DOB: 11-Oct-1965, 48 y.o.   MRN: [id number]  HPI 48 year old male presents for followup of recent shortness of breath on exertion. He initially presented in November 2014 complaining of approximately 6 month history of intermittent dyspnea on exertion. Chest x-ray at that time suggested right-sided pneumonia. He was treated with Levaquin and albuterol with improvement in his symptoms. Followup chest x-ray performed in December 2014 suggested nodular area in the right lung. 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 There was no hilar or mediastinal lymphadenopathy. He reports that symptoms of shortness of breath have been relatively stable. He is able to exercise, such as walking on a treadmill with minimal shortness of breath. He denies chest pain. He denies any weight loss or change in appetite. He is not a smoker however both of his parents smoked. He has no known family history of lung cancer. He has not recently traveled. He has lived in [place] throughout his life. No previous history of pulmonary infections.  Outpatient Prescriptions Prior to Visit  Medication Sig Dispense Refill  . aspirin 81 MG tablet Take 81 mg by mouth daily.      . citalopram (CELEXA) 20 MG tablet TAKE 1 TABLET (20 MG TOTAL) BY MOUTH DAILY.  90 tablet  3  . CRESTOR 20 MG tablet TAKE 1 TABLET (20 MG TOTAL) BY MOUTH DAILY.  90 tablet  1  . fluticasone (FLONASE) 50 MCG/ACT nasal spray Place 2 sprays into the nose daily.  16 g  3  . omeprazole (PRILOSEC) 20 MG capsule Take 1 capsule (20 mg total) by mouth daily.  30 capsule  3  . valACYclovir (VALTREX) 500 MG tablet Take 1 tablet (500 mg total) by mouth 2 (two) times daily.  90 tablet  4  . meloxicam (MOBIC) 15 MG tablet Take 1 tablet (15 mg total) by mouth daily.  30 tablet  3   No facility-administered medications prior to visit.    BP 140/90  Pulse 98  Temp(Src) 98.1 F (36.7 C) (Oral)  Wt 254 lb (115.214 kg)  SpO2 98%  Review of Systems  Constitutional: Negative for fever, chills, activity change and fatigue.  HENT: Negative for congestion, ear discharge, ear pain, hearing loss, nosebleeds, postnasal drip, rhinorrhea, sinus pressure, sneezing, sore throat, tinnitus, trouble swallowing and voice change.   Eyes: Negative for discharge, redness, itching and visual disturbance.  Respiratory: Positive for shortness of breath. Negative for cough, chest tightness, wheezing and stridor.   Cardiovascular: Negative for chest pain and leg swelling.  Musculoskeletal: Negative for arthralgias, myalgias, neck pain and neck stiffness.  Skin: Negative for color change and rash.  Neurological: Negative for dizziness, facial asymmetry and headaches.  Psychiatric/Behavioral: Negative for sleep disturbance. The patient is nervous/anxious.        Objective:   Physical Exam  Constitutional: He is oriented to person, place, and time. He appears well-developed and well-nourished. No distress.  HENT:  Head: Normocephalic and atraumatic.  Right Ear: External ear normal.  Left Ear: External ear normal.  Nose: Nose normal.  Mouth/Throat: Oropharynx is clear and moist.  Eyes: Conjunctivae and EOM are normal. Pupils are equal, round, and reactive to light. Right eye exhibits no discharge. Left eye exhibits no discharge. No scleral icterus.  Neck: Normal range of motion. Neck supple. No tracheal deviation present. No thyromegaly present.  Pulmonary/Chest:  Effort normal.  Musculoskeletal: Normal range of motion. He exhibits no edema.  Lymphadenopathy:    He has no cervical adenopathy.  Neurological: He is alert and oriented to person, place, and time. No cranial nerve deficit. Coordination normal.  Skin: Skin is warm and dry. No rash noted. He is not diaphoretic. No erythema. No pallor.  Psychiatric: His behavior is normal. Judgment  and thought content normal. His mood appears anxious.          Assessment & Plan:

## 2013-04-24 NOTE — Progress Notes (Signed)
Pre-visit discussion using our clinic review tool. No additional management support is needed unless otherwise documented below in the visit note.  

## 2013-04-24 NOTE — Assessment & Plan Note (Addendum)
6 months of dyspnea with exertion. Symptoms initially thought secondary to pneumonia, treated with Levaquin and albuterol with improvement. However, symptoms recurrent. Recent CT shows right lower lobe mass, however I question whether this is leading to dyspnea, given size of lesion. Question if PFTs might be helpful. Pulmonary referral placed. Note ECHO 02/2013 normal.

## 2013-04-24 NOTE — Telephone Encounter (Signed)
Office for Dr Gilford Rile has closed; I called the patient directly and set up appointment with CY tomorrow at 10:30am. Pt will be here at 10:15am.

## 2013-04-24 NOTE — Assessment & Plan Note (Addendum)
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 Discussed with pt and his wife today. Referral to pulmonary medicine placed. Discussed need for likely bronchoscopy with biopsy and possible PET CT.   Over 5min of which >50% spent in face-to-face contact with patient and his wife discussing plan of care

## 2013-04-25 ENCOUNTER — Other Ambulatory Visit (INDEPENDENT_AMBULATORY_CARE_PROVIDER_SITE_OTHER): Payer: 59

## 2013-04-25 ENCOUNTER — Encounter: Payer: Self-pay | Admitting: Internal Medicine

## 2013-04-25 ENCOUNTER — Ambulatory Visit (INDEPENDENT_AMBULATORY_CARE_PROVIDER_SITE_OTHER): Payer: 59 | Admitting: Internal Medicine

## 2013-04-25 VITALS — BP 122/74 | HR 73 | Ht 72.0 in | Wt 256.8 lb

## 2013-04-25 DIAGNOSIS — R918 Other nonspecific abnormal finding of lung field: Secondary | ICD-10-CM

## 2013-04-25 DIAGNOSIS — R0602 Shortness of breath: Secondary | ICD-10-CM

## 2013-04-25 DIAGNOSIS — R222 Localized swelling, mass and lump, trunk: Secondary | ICD-10-CM

## 2013-04-25 LAB — PROTIME-INR
INR: 1.2 ratio — ABNORMAL HIGH (ref 0.8–1.0)
PROTHROMBIN TIME: 13 s — AB (ref 10.2–12.4)

## 2013-04-25 LAB — APTT: APTT: 31.1 s — AB (ref 21.7–28.8)

## 2013-04-25 NOTE — Patient Instructions (Signed)
Order- lab- PT/INR, PTT      Dx lung mass  Office spirometry today  My staff will call Radiology at [facility] and ask they prepare a disk of your recent Chest CT for you to pick up and bring to me.  Order- schedule PET neck to thigh   Dx lung mass  I will review the images with one of my associates here and work to get you scheduled for a biopsy so we can move forward.

## 2013-04-25 NOTE — Assessment & Plan Note (Signed)
Response to bronchodilator not assessed. Unclear if this is mild reversible obstruction/ asthma, or fixed/ COPD. Not limiting necessary care.His self-reported 25 lb weight gain likely also contributes to dyspnea, although he states he does cardio exercise 30 min/ daily.

## 2013-04-25 NOTE — Progress Notes (Signed)
04/25/13- 48 yoM never smoker  Referred courtesy of Dr Annie Sable Chest-mass on right lung Wife here He grew up in a smoking family, but has no prior hx of lung disease. He and his family shared an acute viral bronchitis "chest cold" in November. He noted shortness of breath after that on stairs and playing golf, but attributed this to 25 lb weight gain in last few years.CXR ?'d pneumonia and Rx'd w/ abx. F/u 5 weeks later with Repeat CXR showed persistent density R mid lung.  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 He reports no cough blood, nodes, chest pain, sweats or fever now "not sick", but still a little short of breath with exertion. 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  ECHO 11.21.14- EF 60-65%. Gr1 diast dysfunction. CXR 04/18/13- IMPRESSION:  Persistent rounded opacity in the right perihilar region, worrisome  for a nodule or mass. CT chest with contrast is recommended in  further evaluation. These results will be called to the ordering  clinician or representative by the Radiologist Assistant, and  communication documented in the PACS Dashboard.  Electronically Signed  By: Lorin Picket M.D.  On: 04/17/2013 09:39 Lab review- no hx anemia. Prior eosinophilia >10%.  Office spirometry 04/25/13- Mild obstructive airways disease. FVC 4.28/ 81%, FEV1 3.19/ 75%, FEV1/FVC 0.74, FEF25-75% 2.14/ 0.52  Prior to Admission medications   Medication Sig Start Date End Date Taking? Authorizing Provider  aspirin 81 MG tablet Take 81 mg by mouth daily.   Yes Historical Provider, MD  citalopram (CELEXA) 20 MG tablet TAKE 1 TABLET (20 MG TOTAL) BY MOUTH DAILY. 05/03/12  Yes Jackolyn Confer, MD  CRESTOR 20 MG tablet TAKE 1 TABLET (20 MG TOTAL) BY MOUTH DAILY. 03/27/13  Yes Jackolyn Confer, MD  fluticasone (FLONASE) 50 MCG/ACT nasal spray Place 2 sprays into the nose daily. 02/20/13   Yes Raquel Rey, NP  omeprazole (PRILOSEC) 20 MG capsule Take 1 capsule (20 mg total) by mouth daily. 02/01/13  Yes Jackolyn Confer, MD  valACYclovir (VALTREX) 500 MG tablet Take 500 mg by mouth daily. 09/30/12  Yes Jackolyn Confer, MD  meloxicam (MOBIC) 15 MG tablet Take 1 tablet (15 mg total) by mouth daily. 02/27/13   Max Villa Herb, DPM   Past Medical History  Diagnosis Date  . Hyperlipidemia   . Thyroid disease   . Polyp of colon     Dr. Candace Cruise at [place]  . Thyroid nodule     followed by periodic [place]  . Sleep apnea     on CPAP, Sleep Med   Past Surgical History  Procedure Laterality Date  . Nasal septum surgery  2009    Dr. Carlis Abbott  . Meniscus repair  2012    left knee, [facility]. Judi Cong [facility]  . Tonsillectomy and adenoidectomy  1973    unsure if tonsills were removed   Family History  Problem Relation Age of Onset  . Heart disease Father     s/p CABG  . Hypertension Father   . Diabetes Father   . Hyperlipidemia Father   . Hyperlipidemia Maternal Grandmother   . Hypertension Maternal Grandmother   . Hyperlipidemia Maternal Grandfather   . Heart disease Maternal Grandfather   . Hypertension Maternal Grandfather   . Cancer Other     cancer  . Diabetes Other   . Heart disease Maternal Uncle   .  Heart disease Paternal Uncle   . Heart disease Paternal Grandfather   . Healthy Mother    History   Social History  . Marital Status: Married    Spouse Name: N/A    Number of Children: 2  . Years of Education: N/A   Occupational History  . sales    Social History Main Topics  . Smoking status: Never Smoker   . Smokeless tobacco: Not on file  . Alcohol Use: Yes     Comment: ocassionaly-beer  . Drug Use: No  . Sexual Activity: Not on file   Other Topics Concern  . Not on file   Social History Narrative   Lives in [place] with wife and 2 children, 19 and 16YO. Dogs in home.      Work - Geologist, engineering   Diet - healthy   Exercise - 23min cardio per  day, trainer, golf   ROS-see HPI Constitutional:   No-   weight loss, night sweats, fevers, chills, fatigue, lassitude. HEENT:   No-  headaches, difficulty swallowing, tooth/dental problems, sore throat,       No-  sneezing, itching, ear ache, nasal congestion, post nasal drip,  CV:  No-   chest pain, orthopnea, PND, swelling in lower extremities, anasarca,  dizziness, palpitations Resp: +shortness of breath with exertion or at rest.              No-   productive cough,  No non-productive cough,  No- coughing up of blood.              No-   change in color of mucus.  No- wheezing.   Skin: No-   rash or lesions. GI:  No-   heartburn, indigestion, abdominal pain, nausea, vomiting, diarrhea,                 change in bowel habits, loss of appetite GU: No-   dysuria, change in color of urine, no urgency or frequency.  No- flank pain. MS:  No-   joint pain or swelling.  No- decreased range of motion.  No- back pain. Neuro-     nothing unusual Psych:  No- change in mood or affect. No depression or anxiety.  No memory loss.  OBJ- Physical Exam General- Alert, Oriented, Affect-appropriate, Distress- none acute. Appears well. Skin- rash-none, lesions- none, excoriation- none Lymphadenopathy- none Head- atraumatic            Eyes- Gross vision intact, PERRLA, conjunctivae and secretions clear            Ears- Hearing, canals-normal            Nose- Clear, no-Septal dev, mucus, polyps, erosion, perforation             Throat- Mallampati II , mucosa clear , drainage- none, tonsils- atrophic Neck- flexible , trachea midline, no stridor , thyroid nl, carotid no bruit Chest - symmetrical excursion , unlabored           Heart/CV- RRR , no murmur , no gallop  , no rub, nl s1 s2                           - JVD- none , edema- none, stasis changes- none, varices- none           Lung- clear to P&A, wheeze- none, cough- none , dullness-none, rub- none           Chest wall-  Abd- tender-no, distended-no,  bowel sounds-present, HSM- no Br/ Gen/ Rectal- Not done, not indicated Extrem- cyanosis- none, clubbing, none, atrophy- none, strength- nl Neuro- grossly intact to observation

## 2013-04-26 ENCOUNTER — Telehealth: Payer: Self-pay | Admitting: Emergency Medicine

## 2013-04-26 NOTE — Telephone Encounter (Signed)
Called and spoke with ct scan in [place] and they stated that they can do the ct scan super d but wanted to know if RB needed the disc with the images on it or do you just need the report sent.  RB please advise. thanks

## 2013-04-26 NOTE — Telephone Encounter (Signed)
i need the disk with images, processed as superD protocol, sent to me ASAP. If they have questions about the superd format i can probably help them, they can call.

## 2013-04-26 NOTE — Progress Notes (Signed)
Quick Note:  Spoke with patient-aware to stop ASA prior to any biopsy procedure. ______

## 2013-04-27 ENCOUNTER — Other Ambulatory Visit: Payer: Self-pay | Admitting: Internal Medicine

## 2013-04-27 ENCOUNTER — Telehealth: Payer: Self-pay | Admitting: Internal Medicine

## 2013-04-27 ENCOUNTER — Institutional Professional Consult (permissible substitution): Payer: 59 | Admitting: Internal Medicine

## 2013-04-27 NOTE — Telephone Encounter (Signed)
I called and spoke with Robert Morse from [facility]. She is aware of what is needed. She also took our office address to send as well. Nothing further needed

## 2013-04-27 NOTE — Telephone Encounter (Signed)
Robert Morse  I got note 04/24/13 from pcp [name], MD to see this patient ASAP this week for Lung mass. The CT was done at [facility] apparently but is not in our PACS system. So, I do not know. I can see him 04/27/13 if you can get him in the morning. OR else, early next week.Let me know by10am 04/27/13  Dr. Brand Males, M.D., [facility].C.P Pulmonary and Critical Care Medicine Staff Physician [facility] Pager: [phone], If no answer or between  15:00h - 7:00h: call [phone]  04/27/2013 12:12 AM

## 2013-04-27 NOTE — Telephone Encounter (Signed)
Pt saw Dr. Annamaria Boots on 04-25-13 and he will continue to follow with him.[place] Bing, CMA

## 2013-04-27 NOTE — Telephone Encounter (Signed)
Spoke with pt. Made him aware we are awaiting the super D CT disk. Advised him once this is reviewed as Dr. Annamaria Boots stated we will contact him. Nothing further needed

## 2013-04-28 ENCOUNTER — Other Ambulatory Visit: Payer: Self-pay | Admitting: Emergency Medicine

## 2013-04-28 DIAGNOSIS — R918 Other nonspecific abnormal finding of lung field: Secondary | ICD-10-CM

## 2013-04-28 NOTE — Progress Notes (Signed)
Reviewed films with Dr Annamaria Boots, CT chest. Agree that bx should be possible by ENB. Will work on setting up. [place] is working on making a superD protocol disk

## 2013-05-01 ENCOUNTER — Other Ambulatory Visit: Payer: Self-pay | Admitting: Internal Medicine

## 2013-05-02 ENCOUNTER — Ambulatory Visit (INDEPENDENT_AMBULATORY_CARE_PROVIDER_SITE_OTHER): Payer: 59 | Admitting: Internal Medicine

## 2013-05-02 ENCOUNTER — Encounter: Payer: Self-pay | Admitting: Internal Medicine

## 2013-05-02 ENCOUNTER — Encounter (HOSPITAL_COMMUNITY): Payer: Self-pay

## 2013-05-02 ENCOUNTER — Encounter (HOSPITAL_COMMUNITY)
Admission: RE | Admit: 2013-05-02 | Discharge: 2013-05-02 | Disposition: A | Discharge status: Home / Self Care | Payer: 59 | Source: Ambulatory Visit | Attending: Internal Medicine | Admitting: Internal Medicine

## 2013-05-02 VITALS — BP 140/90 | HR 73 | Temp 98.1°F | Wt 256.0 lb

## 2013-05-02 DIAGNOSIS — R918 Other nonspecific abnormal finding of lung field: Secondary | ICD-10-CM

## 2013-05-02 DIAGNOSIS — R222 Localized swelling, mass and lump, trunk: Secondary | ICD-10-CM

## 2013-05-02 DIAGNOSIS — I2510 Atherosclerotic heart disease of native coronary artery without angina pectoris: Secondary | ICD-10-CM

## 2013-05-02 DIAGNOSIS — J0100 Acute maxillary sinusitis, unspecified: Secondary | ICD-10-CM | POA: Insufficient documentation

## 2013-05-02 LAB — GLUCOSE, CAPILLARY: GLUCOSE-CAPILLARY: 94 mg/dL (ref 70–99)

## 2013-05-02 MED ORDER — FLUDEOXYGLUCOSE F - 18 (FDG) INJECTION
19.4000 | Freq: Once | INTRAVENOUS | Status: AC | PRN
  Administered 2013-05-02: 19.4 via INTRAVENOUS

## 2013-05-02 MED ORDER — AMOXICILLIN-POT CLAVULANATE 875-125 MG PO TABS: 1.0000 | ORAL_TABLET | Freq: Two times a day (BID) | ORAL | Status: DC

## 2013-05-02 NOTE — Progress Notes (Signed)
Subjective:    Patient ID: Robert Morse, male    DOB: 1966-03-18, 48 y.o.   MRN: [id number]  HPI 48 year old male presents for acute visit complaining of four-day history of nasal congestion, sinus pressure, purulent sinus drainage. He has a history of sinus surgery in the past and has had frequent sinus infections. He had occasional nonproductive cough. He has chronic shortness of breath without change. He denies chest pain.  He was also recently diagnosed with right lower lobe lung mass seen on CT. He underwent PET CT today. He has been reviewing the results through [facility] and would like to go over them. He is scheduled for biopsy of this lung nodule on 1/26.  No change in mild shortness of breath which has been present for several months.   Outpatient Encounter Prescriptions as of 05/02/2013  Medication Sig  . citalopram (CELEXA) 20 MG tablet Take 20 mg by mouth daily.  . fluticasone (FLONASE) 50 MCG/ACT nasal spray Place 2 sprays into both nostrils daily.  Marland Kitchen omeprazole (PRILOSEC) 20 MG capsule Take 1 capsule (20 mg total) by mouth daily.  . rosuvastatin (CRESTOR) 20 MG tablet Take 20 mg by mouth daily.  . valACYclovir (VALTREX) 500 MG tablet Take 500 mg by mouth daily.  Marland Kitchen amoxicillin-clavulanate (AUGMENTIN) 875-125 MG per tablet Take 1 tablet by mouth 2 (two) times daily.  Marland Kitchen aspirin EC 81 MG tablet Take 81 mg by mouth daily.   BP 140/90  Pulse 73  Temp(Src) 98.1 F (36.7 C) (Oral)  Wt 256 lb (116.121 kg)  SpO2 99%  Review of Systems  Constitutional: Negative for fever, chills, activity change and fatigue.  HENT: Positive for congestion, postnasal drip and sinus pressure. Negative for ear discharge, ear pain, hearing loss, nosebleeds, rhinorrhea, sneezing, sore throat, tinnitus, trouble swallowing and voice change.   Eyes: Negative for discharge, redness, itching and visual disturbance.  Respiratory: Negative for cough, chest tightness, shortness of breath, wheezing and stridor.    Cardiovascular: Negative for chest pain and leg swelling.  Musculoskeletal: Negative for arthralgias, myalgias, neck pain and neck stiffness.  Skin: Negative for color change and rash.  Neurological: Negative for dizziness, facial asymmetry and headaches.  Psychiatric/Behavioral: Negative for sleep disturbance.       Objective:   Physical Exam  Constitutional: He is oriented to person, place, and time. He appears well-developed and well-nourished. No distress.  HENT:  Head: Normocephalic and atraumatic.  Right Ear: Tympanic membrane and external ear normal.  Left Ear: External ear normal.  Nose: Mucosal edema and rhinorrhea present. Right sinus exhibits maxillary sinus tenderness. Left sinus exhibits maxillary sinus tenderness.  Mouth/Throat: Oropharynx is clear and moist. No oropharyngeal exudate.  Eyes: Conjunctivae and EOM are normal. Pupils are equal, round, and reactive to light. Right eye exhibits no discharge. Left eye exhibits no discharge. No scleral icterus.  Neck: Normal range of motion. Neck supple. No tracheal deviation present. No thyromegaly present.  Cardiovascular: Normal rate, regular rhythm and normal heart sounds.  Exam reveals no gallop and no friction rub.   No murmur heard. Pulmonary/Chest: Effort normal and breath sounds normal. No respiratory distress. He has no wheezes. He has no rales. He exhibits no tenderness.  Musculoskeletal: Normal range of motion. He exhibits no edema.  Lymphadenopathy:    He has no cervical adenopathy.  Neurological: He is alert and oriented to person, place, and time. No cranial nerve deficit. Coordination normal.  Skin: Skin is warm and dry. No rash noted. He is not  diaphoretic. No erythema. No pallor.  Psychiatric: He has a normal mood and affect. His behavior is normal. Judgment and thought content normal.          Assessment & Plan:

## 2013-05-02 NOTE — Assessment & Plan Note (Signed)
Symptoms consistent with recurrent acute maxillary sinusitis. Will start Augmentin. Continue prn benadryl, claritin. Ibuprofen or Tylenol prn pain. Follow up later this week if symptoms not improving. If no improvement, will add Prednisone.

## 2013-05-02 NOTE — Progress Notes (Signed)
Pre-visit discussion using our clinic review tool. No additional management support is needed unless otherwise documented below in the visit note.  

## 2013-05-02 NOTE — Assessment & Plan Note (Signed)
CAD left main seen on PET CT. Pt has h/o normal cath per his report approx 5 years ago. Will try to obtain records on this. Will also set up cardiology evaluation. Question if he may need repeat cath or stress test for further assessment. Question if this lesion may be cause of several months of worsening shortness of breath.

## 2013-05-02 NOTE — Assessment & Plan Note (Signed)
CT chest [facility] 04/21/13 2.8x 3.1 cm mass mass cross major fissure. Possible tree in bud LLL. With hx of eosinophilia, and no comparison image from > Nov, 2014, this might prove benign in a non-smoker. He agrees with necessity to assume this is cancer until proven otherwise. Biopsy access compared needle vs bronch. Lesion is pretty deep for needle. He will bring disk of CT images from [facility]- we have called radiology there to request. I will review this here but anticipate guided bronchoscopy as discussed. Questions answered to the best of my availability with information on hand at this point.  Plan- PET scheduled. INR for bx. He will bring CT image disk for our review and we will f/u to schedule biopsy.

## 2013-05-02 NOTE — Assessment & Plan Note (Signed)
Reviewed recent PET CT findings with pt and his wife. PET CT shows single hypermetabolic lesion in the right lower lobe. Discussed need to proceed with biopsy as scheduled. Follow up after biopsy complete.

## 2013-05-05 ENCOUNTER — Telehealth: Payer: Self-pay | Admitting: Internal Medicine

## 2013-05-05 ENCOUNTER — Emergency Department: Payer: Self-pay | Admitting: Emergency Medicine

## 2013-05-05 ENCOUNTER — Telehealth: Payer: Self-pay | Admitting: Emergency Medicine

## 2013-05-05 ENCOUNTER — Telehealth: Payer: Self-pay | Admitting: Critical Care Medicine

## 2013-05-05 ENCOUNTER — Telehealth: Payer: Self-pay

## 2013-05-05 ENCOUNTER — Encounter: Payer: Self-pay | Admitting: *Deleted

## 2013-05-05 ENCOUNTER — Encounter (HOSPITAL_COMMUNITY)
Admission: RE | Admit: 2013-05-05 | Discharge: 2013-05-05 | Disposition: A | Discharge status: Home / Self Care | Payer: 59 | Source: Ambulatory Visit | Attending: Emergency Medicine | Admitting: Emergency Medicine

## 2013-05-05 DIAGNOSIS — Z01812 Encounter for preprocedural laboratory examination: Secondary | ICD-10-CM | POA: Insufficient documentation

## 2013-05-05 DIAGNOSIS — Z01818 Encounter for other preprocedural examination: Secondary | ICD-10-CM | POA: Insufficient documentation

## 2013-05-05 HISTORY — DX: Unspecified atherosclerosis: I70.90

## 2013-05-05 HISTORY — DX: Depression, unspecified: F32.A

## 2013-05-05 HISTORY — DX: Personal history of other diseases of the musculoskeletal system and connective tissue: Z87.39

## 2013-05-05 HISTORY — DX: Shortness of breath: R06.02

## 2013-05-05 HISTORY — DX: Gastro-esophageal reflux disease without esophagitis: K21.9

## 2013-05-05 HISTORY — DX: Chronic sinusitis, unspecified: J32.9

## 2013-05-05 HISTORY — DX: Major depressive disorder, single episode, unspecified: F32.9

## 2013-05-05 HISTORY — DX: Personal history of other diseases of the respiratory system: Z87.09

## 2013-05-05 LAB — CBC
HCT: 44.6 % (ref 39.0–52.0)
HCT: 44.4 % (ref 40.0–52.0)
HGB: 15.7 g/dL (ref 13.0–18.0)
Hemoglobin: 16 g/dL (ref 13.0–17.0)
MCH: 32.4 pg (ref 26.0–34.0)
MCH: 31.6 pg (ref 26.0–34.0)
MCHC: 35.9 g/dL (ref 30.0–36.0)
MCHC: 35.4 g/dL (ref 32.0–36.0)
MCV: 90.3 fL (ref 78.0–100.0)
MCV: 89 fL (ref 80–100)
PLATELETS: 258 10*3/uL (ref 150–440)
Platelets: 272 10*3/uL (ref 150–400)
RBC: 4.94 MIL/uL (ref 4.22–5.81)
RBC: 4.98 10*6/uL (ref 4.40–5.90)
RDW: 12.3 % (ref 11.5–15.5)
RDW: 12.6 % (ref 11.5–14.5)
WBC: 7.9 10*3/uL (ref 4.0–10.5)
WBC: 8.5 10*3/uL (ref 3.8–10.6)

## 2013-05-05 LAB — COMPREHENSIVE METABOLIC PANEL
ALK PHOS: 52 U/L (ref 39–117)
ALK PHOS: 53 U/L
ALT: 59 U/L — AB (ref 0–53)
ANION GAP: 6 — AB (ref 7–16)
AST: 32 U/L (ref 0–37)
AST: 34 U/L (ref 15–37)
Albumin: 4.4 g/dL (ref 3.5–5.2)
Albumin: 4.3 g/dL (ref 3.4–5.0)
BUN: 16 mg/dL (ref 6–23)
BUN: 14 mg/dL (ref 7–18)
Bilirubin,Total: 0.5 mg/dL (ref 0.2–1.0)
CO2: 26 mEq/L (ref 19–32)
Calcium, Total: 9.6 mg/dL (ref 8.5–10.1)
Calcium: 9.9 mg/dL (ref 8.4–10.5)
Chloride: 98 mEq/L (ref 96–112)
Chloride: 104 mmol/L (ref 98–107)
Co2: 26 mmol/L (ref 21–32)
Creatinine, Ser: 0.97 mg/dL (ref 0.50–1.35)
Creatinine: 0.87 mg/dL (ref 0.60–1.30)
EGFR (Non-African Amer.): >60
GFR calc non Af Amer: >90 mL/min (ref >90)
Glucose, Bld: 77 mg/dL (ref 70–99)
Glucose: 91 mg/dL (ref 65–99)
OSMOLALITY: 272 (ref 275–301)
POTASSIUM: 4.1 meq/L (ref 3.7–5.3)
POTASSIUM: 4 mmol/L (ref 3.5–5.1)
SGPT (ALT): 71 U/L (ref 12–78)
SODIUM: 139 meq/L (ref 137–147)
SODIUM: 136 mmol/L (ref 136–145)
TOTAL PROTEIN: 8 g/dL (ref 6.0–8.3)
Total Bilirubin: 0.5 mg/dL (ref 0.3–1.2)
Total Protein: 8.1 g/dL (ref 6.4–8.2)

## 2013-05-05 LAB — TROPONIN I: Troponin-I: <0.02 ng/mL

## 2013-05-05 LAB — PROTIME-INR
INR: 1.04 (ref 0.00–1.49)
INR: 1.1
PROTHROMBIN TIME: 13.6 s (ref 11.5–14.7)
Prothrombin Time: 13.4 seconds (ref 11.6–15.2)

## 2013-05-05 LAB — APTT: APTT: 31 s (ref 24–37)

## 2013-05-05 LAB — CK TOTAL AND CKMB (NOT AT ARMC)
CK, TOTAL: 202 U/L (ref 35–232)
CK-MB: 1 ng/mL (ref 0.5–3.6)

## 2013-05-05 NOTE — Pre-Procedure Instructions (Signed)
Robert Morse  05/05/2013   Your procedure is scheduled on:  Mon, Jan 26 @ 7:30 AM  Report to [facility] at 5:30 AM.  Call this number if you have problems the morning of surgery: [phone]   Remember:   Do not eat food or drink liquids after midnight.   Take these medicines the morning of surgery with A SIP OF WATER: Celexa(Citalopram),Fluticasone(Flonase),Omeprazole(Prilosec),and Valtrex(Valcyclovir)               No Goody's,BC's,Aleve,Aspirin,Ibuprofen,Fish Oil,or any Herbal Medications   Do not wear jewelry  Do not wear lotions, powders, or colognes. You may wear deodorant.  Men may shave face and neck.  Do not bring valuables to the hospital.  [facility] is not responsible                  for any belongings or valuables.               Contacts, dentures or bridgework may not be worn into surgery.  Leave suitcase in the car. After surgery it may be brought to your room.  For patients admitted to the hospital, discharge time is determined by your                treatment team.               Patients discharged the day of surgery will not be allowed to drive  home.    Special Instructions: Shower using CHG 2 nights before surgery and the night before surgery.  If you shower the day of surgery use CHG.  Use special wash - you have one bottle of CHG for all showers.  You should use approximately 1/3 of the bottle for each shower.   Please read over the following fact sheets that you were given: Pain Booklet, Coughing and Deep Breathing and Surgical Site Infection Prevention

## 2013-05-05 NOTE — Progress Notes (Signed)
Sleep study requested from [facility] in [facility] ph [phone]

## 2013-05-05 NOTE — Telephone Encounter (Signed)
I attempted to return his call. LMOM. He has Monday appointment here.

## 2013-05-05 NOTE — Telephone Encounter (Signed)
Pt called, having severe pain in chest This seems out of proportion to his lung mass I advised he go to ED asap  He will go to [facility] ED

## 2013-05-05 NOTE — Telephone Encounter (Signed)
Pt was @[facility]  ED today for chest pain.  Per RB, we are going to have pt see CY on 05/08/13 @1 :45.  Appt ok'ed by CY, pt confirmed time, RB aware of this.  Nothing further needed at this time.

## 2013-05-05 NOTE — Progress Notes (Addendum)
Pt to see Dr.Arida on the 27th  D/t pet scan results  Echo report in epic from 2014  EKG in epic from 03-01-13  CXR in epic from 04-18-13  Medical Md is Dr.Jennifer Gilford Rile  Stress test and heart cath in 2009

## 2013-05-05 NOTE — Telephone Encounter (Signed)
He had called about R sided chest pain. Not mentioned in note by his PCP few days ago. No chest wall activity on recent PET, but he has pleural tenting dendritic from the mass. Bronch by Dr Lamonte Sakai scheduled 1/26. Nurses asked I call him- I got his voice mail.

## 2013-05-05 NOTE — Telephone Encounter (Signed)
I called and spoke with pt. He c/o right side pain. Feels in his rib cage (side where nodule/mass is). This started yesterday, when he inhales the pain is worse. Pain getting worse. He feels like he may end up in the ED. He has not taken anything for the pain. Please advise Dr. Annamaria Boots thanks  No Known Allergies

## 2013-05-05 NOTE — Telephone Encounter (Signed)
Please have patient come in on Monday 05-08-13 at 1:45pm. Thanks.

## 2013-05-05 NOTE — Telephone Encounter (Signed)
RB was to bronch this pt on the 26th, per RB the pt is in pre-admit at [facility] ED for chest pain.  Per RB, this patient needs to see CY or TP asap.  This is a CY pt.  Katie please advise. Thank you.

## 2013-05-05 NOTE — Progress Notes (Signed)
Spoke with Dr.Byrum about the patient having a sharp,constant pain in his right chest area.Told him I planned on getting a chest xray and ok'd that order.His office will contact pt to have in for a consultation next week prior to bronchoscopy

## 2013-05-07 ENCOUNTER — Encounter (HOSPITAL_COMMUNITY): Payer: Self-pay | Admitting: Emergency Medicine

## 2013-05-07 ENCOUNTER — Other Ambulatory Visit: Payer: Self-pay

## 2013-05-07 ENCOUNTER — Emergency Department (HOSPITAL_COMMUNITY): Payer: 59

## 2013-05-07 ENCOUNTER — Emergency Department (HOSPITAL_COMMUNITY)
Admission: EM | Admit: 2013-05-07 | Discharge: 2013-05-07 | Disposition: A | Discharge status: Home / Self Care | Payer: 59 | Attending: Emergency Medicine | Admitting: Emergency Medicine

## 2013-05-07 DIAGNOSIS — G4730 Sleep apnea, unspecified: Secondary | ICD-10-CM | POA: Insufficient documentation

## 2013-05-07 DIAGNOSIS — Z79899 Other long term (current) drug therapy: Secondary | ICD-10-CM | POA: Insufficient documentation

## 2013-05-07 DIAGNOSIS — Z9889 Other specified postprocedural states: Secondary | ICD-10-CM | POA: Insufficient documentation

## 2013-05-07 DIAGNOSIS — E785 Hyperlipidemia, unspecified: Secondary | ICD-10-CM | POA: Insufficient documentation

## 2013-05-07 DIAGNOSIS — K219 Gastro-esophageal reflux disease without esophagitis: Secondary | ICD-10-CM | POA: Insufficient documentation

## 2013-05-07 DIAGNOSIS — F329 Major depressive disorder, single episode, unspecified: Secondary | ICD-10-CM | POA: Insufficient documentation

## 2013-05-07 DIAGNOSIS — F3289 Other specified depressive episodes: Secondary | ICD-10-CM | POA: Insufficient documentation

## 2013-05-07 DIAGNOSIS — Z860100 Personal history of colon polyps, unspecified: Secondary | ICD-10-CM | POA: Insufficient documentation

## 2013-05-07 DIAGNOSIS — R091 Pleurisy: Secondary | ICD-10-CM | POA: Insufficient documentation

## 2013-05-07 DIAGNOSIS — IMO0002 Reserved for concepts with insufficient information to code with codable children: Secondary | ICD-10-CM | POA: Insufficient documentation

## 2013-05-07 DIAGNOSIS — Z8679 Personal history of other diseases of the circulatory system: Secondary | ICD-10-CM | POA: Insufficient documentation

## 2013-05-07 DIAGNOSIS — Z7982 Long term (current) use of aspirin: Secondary | ICD-10-CM | POA: Insufficient documentation

## 2013-05-07 LAB — CBC WITH DIFFERENTIAL/PLATELET
Basophils Absolute: 0 10*3/uL (ref 0.0–0.1)
Basophils Relative: 1 % (ref 0–1)
EOS ABS: 0.4 10*3/uL (ref 0.0–0.7)
EOS PCT: 6 % — AB (ref 0–5)
HEMATOCRIT: 42.4 % (ref 39.0–52.0)
Hemoglobin: 15 g/dL (ref 13.0–17.0)
LYMPHS ABS: 1.2 10*3/uL (ref 0.7–4.0)
Lymphocytes Relative: 18 % (ref 12–46)
MCH: 32.2 pg (ref 26.0–34.0)
MCHC: 35.4 g/dL (ref 30.0–36.0)
MCV: 91 fL (ref 78.0–100.0)
MONO ABS: 0.6 10*3/uL (ref 0.1–1.0)
Monocytes Relative: 8 % (ref 3–12)
Neutro Abs: 4.5 10*3/uL (ref 1.7–7.7)
Neutrophils Relative %: 68 % (ref 43–77)
PLATELETS: 245 10*3/uL (ref 150–400)
RBC: 4.66 MIL/uL (ref 4.22–5.81)
RDW: 12.5 % (ref 11.5–15.5)
WBC: 6.7 10*3/uL (ref 4.0–10.5)

## 2013-05-07 LAB — BASIC METABOLIC PANEL
BUN: 13 mg/dL (ref 6–23)
CALCIUM: 9.4 mg/dL (ref 8.4–10.5)
CO2: 27 mEq/L (ref 19–32)
Chloride: 99 mEq/L (ref 96–112)
Creatinine, Ser: 0.91 mg/dL (ref 0.50–1.35)
GFR calc Af Amer: >90 mL/min (ref >90)
Glucose, Bld: 97 mg/dL (ref 70–99)
Potassium: 4.1 mEq/L (ref 3.7–5.3)
SODIUM: 140 meq/L (ref 137–147)

## 2013-05-07 LAB — TROPONIN I: Troponin I: <0.3 ng/mL (ref <0.30)

## 2013-05-07 NOTE — ED Notes (Signed)
Pt presents to department for evaluation of R sided rib pain. Pt states he has nodule to R lung, was seen on Friday for same, was told he had "fluid around R lung." 5/10 pain. Pt states increased pain and difficulty breathing. Taking oxycodone with minimal relief. Respirations unlabored. Pt is conscious alert and oriented x4.

## 2013-05-07 NOTE — Discharge Instructions (Signed)
Pleurisy Pleurisy is an inflammation and swelling of the lining of the lungs (pleura). Because of this inflammation, it hurts to breathe. It can be aggravated by coughing, laughing, or deep breathing. Pleurisy is often caused by an underlying infection or disease.  HOME CARE INSTRUCTIONS  Monitor your pleurisy for any changes. The following actions may help to alleviate any discomfort you are experiencing:  Medicine may help with pain. Only take over-the-counter or prescription medicines for pain, discomfort, or fever as directed by your health care provider.  Only take antibiotic medicine as directed. Make sure to finish it even if you start to feel better. SEEK MEDICAL CARE IF:   Your pain is not controlled with medicine or is increasing.  You have an increase in pus-like (purulent) secretions brought up with coughing. SEEK IMMEDIATE MEDICAL CARE IF:   You have blue or dark lips, fingernails, or toenails.  You are coughing up blood.  You have increased difficulty breathing.  You have continuing pain unrelieved by medicine or pain lasting more than 1 week.  You have pain that radiates into your neck, arms, or jaw.  You develop increased shortness of breath or wheezing.  You develop a fever, rash, vomiting, fainting, or other serious symptoms. MAKE SURE YOU:  Understand these instructions.   Will watch your condition.   Will get help right away if you are not doing well or get worse.  Document Released: 04/06/2005 Document Revised: 12/07/2012 Document Reviewed: 09/18/2012 [facility] Patient Information 2014 [place].

## 2013-05-07 NOTE — ED Notes (Signed)
Patient transported to X-ray 

## 2013-05-07 NOTE — ED Notes (Signed)
While drawing blood pt had a near syncopal episode. Pt states this has happened over the last few months and does not think it is from the blood draw. Mendel Ryder RN at bedside.

## 2013-05-07 NOTE — ED Provider Notes (Signed)
CSN: [id number]     Arrival date & time 05/07/13  1206 History   First MD Initiated Contact with Patient 05/07/13 1258     Chief Complaint  Patient presents with  . Chest Pain   (Consider location/radiation/quality/duration/timing/severity/associated sxs/prior Treatment) HPI Comments: Patient presents with right-sided chest pain. He has a history of recently diagnosed lung nodule. He had a CT scan on January 2 showing a 2.8 x 3.1 mass in the right lung. There is no other evidence of metastatic disease. He says when he had a PET scan which showed the similar mass but no other signs of metastatic disease. 4 days ago he started having some sharp pains to the right mid chest. He says it hurts with every breath. Also hurts with movement and coughing. He denies any shortness of breath. He denies any fevers or chills. He denies any increased cough or congestion. He said that he's been taking oxycodone for the pain without relief. He was also recently started on Augmentin.  He went to [facility] 2 days ago and had a CTA of his chest which showed no evidence of pulmonary embolus. It did show the same mass in his right lung. There is also some hypoinflation changes and small pleural effusion. He has an appointment to followup with Dr. Annamaria Boots, his pulmonologist tomorrow. He has a biopsy scheduled on January 26 by Dr. Lamonte Sakai. His concern is that he doesn't feel like he can wait another week due to the increase in pain. He requested be admitted for further treatment.  Patient is a 48 y.o. male presenting with chest pain.  Chest Pain Associated symptoms: no abdominal pain, no back pain, no cough, no diaphoresis, no dizziness, no fatigue, no fever, no headache, no nausea, no numbness, no shortness of breath, not vomiting and no weakness     Past Medical History  Diagnosis Date  . Hyperlipidemia     takes Crestor daily  . Polyp of colon     Dr. Candace Cruise at [place]  . Thyroid nodule     small polyp on  thyroid  . GERD (gastroesophageal reflux disease)     takes Omeprazole daily  . Depression     takes Celexa daily  . Atherosclerosis   . Sinus infection     takes Augmentin daily  . Shortness of breath     with exertion  . History of bronchitis     Aug 2014  . History of gout   . Sleep apnea     uses a cpap;sleep study done about a yr and a half ago   Past Surgical History  Procedure Laterality Date  . Nasal septum surgery  7-1yrs ago    Dr. Carlis Abbott  . Meniscus repair  2012    left knee, [facility]. Judi Cong at [facility]  . Tonsillectomy and adenoidectomy  1973    adenoids as a child  . Cardiac catheterization  11/2007    [facility]  . Colonoscopy    . Esophagogastroduodenoscopy     Family History  Problem Relation Age of Onset  . Heart disease Father     s/p CABG  . Hypertension Father   . Diabetes Father   . Hyperlipidemia Father   . Hyperlipidemia Maternal Grandmother   . Hypertension Maternal Grandmother   . Hyperlipidemia Maternal Grandfather   . Heart disease Maternal Grandfather   . Hypertension Maternal Grandfather   . Cancer Other     cancer  . Diabetes Other   . Heart  disease Maternal Uncle   . Heart disease Paternal Uncle   . Heart disease Paternal Grandfather   . Healthy Mother    History  Substance Use Topics  . Smoking status: Never Smoker   . Smokeless tobacco: Not on file  . Alcohol Use: Yes     Comment: nighlty    Review of Systems  Constitutional: Negative for fever, chills, diaphoresis and fatigue.  HENT: Negative for congestion, rhinorrhea and sneezing.   Eyes: Negative.   Respiratory: Negative for cough, chest tightness and shortness of breath.   Cardiovascular: Positive for chest pain. Negative for leg swelling.  Gastrointestinal: Negative for nausea, vomiting, abdominal pain, diarrhea and blood in stool.  Genitourinary: Negative for frequency, hematuria, flank pain and difficulty urinating.  Musculoskeletal: Negative for arthralgias and back  pain.  Skin: Negative for rash.  Neurological: Negative for dizziness, speech difficulty, weakness, numbness and headaches.    Allergies  Morphine and related  Home Medications   Current Outpatient Rx  Name  Route  Sig  Dispense  Refill  . amoxicillin-clavulanate (AUGMENTIN) 875-125 MG per tablet   Oral   Take 1 tablet by mouth 2 (two) times daily.   20 tablet   0   . aspirin EC 81 MG tablet   Oral   Take 81 mg by mouth daily.         . citalopram (CELEXA) 20 MG tablet   Oral   Take 20 mg by mouth daily.         . diazepam (VALIUM) 5 MG tablet   Oral   Take 5 mg by mouth every 6 (six) hours as needed for anxiety.         . fluticasone (FLONASE) 50 MCG/ACT nasal spray   Each Nare   Place 2 sprays into both nostrils daily.         Marland Kitchen omeprazole (PRILOSEC) 20 MG capsule   Oral   Take 1 capsule (20 mg total) by mouth daily.   30 capsule   3   . oxyCODONE-acetaminophen (PERCOCET/ROXICET) 5-325 MG per tablet   Oral   Take 1-2 tablets by mouth every 6 (six) hours as needed for moderate pain or severe pain.         . rosuvastatin (CRESTOR) 20 MG tablet   Oral   Take 20 mg by mouth daily.         . valACYclovir (VALTREX) 500 MG tablet   Oral   Take 500 mg by mouth daily.          BP 122/74  Pulse 71  Temp(Src) 99.3 F (37.4 C) (Oral)  Resp 18  Ht 6' (1.829 m)  Wt 250 lb (113.399 kg)  BMI 33.90 kg/m2  SpO2 96% Physical Exam  Constitutional: He is oriented to person, place, and time. He appears well-developed and well-nourished.  HENT:  Head: Normocephalic and atraumatic.  Eyes: Pupils are equal, round, and reactive to light.  Neck: Normal range of motion. Neck supple.  Cardiovascular: Normal rate, regular rhythm and normal heart sounds.   Pulmonary/Chest: Effort normal and breath sounds normal. No respiratory distress. He has no wheezes. He has no rales. He exhibits no tenderness.  Abdominal: Soft. Bowel sounds are normal. There is no  tenderness. There is no rebound and no guarding.  Musculoskeletal: Normal range of motion. He exhibits no edema.  No calf tenderness  Lymphadenopathy:    He has no cervical adenopathy.  Neurological: He is alert and oriented to person, place,  and time.  Skin: Skin is warm and dry. No rash noted.  Psychiatric: He has a normal mood and affect.    ED Course  Procedures (including critical care time) Labs Review  Results for orders placed during the hospital encounter of 05/07/13  CBC WITH DIFFERENTIAL      Result Value Range   WBC 6.7  4.0 - 10.5 K/uL   RBC 4.66  4.22 - 5.81 MIL/uL   Hemoglobin 15.0  13.0 - 17.0 g/dL   HCT 42.4  39.0 - 52.0 %   MCV 91.0  78.0 - 100.0 fL   MCH 32.2  26.0 - 34.0 pg   MCHC 35.4  30.0 - 36.0 g/dL   RDW 12.5  11.5 - 15.5 %   Platelets 245  150 - 400 K/uL   Neutrophils Relative % 68  43 - 77 %   Neutro Abs 4.5  1.7 - 7.7 K/uL   Lymphocytes Relative 18  12 - 46 %   Lymphs Abs 1.2  0.7 - 4.0 K/uL   Monocytes Relative 8  3 - 12 %   Monocytes Absolute 0.6  0.1 - 1.0 K/uL   Eosinophils Relative 6 (*) 0 - 5 %   Eosinophils Absolute 0.4  0.0 - 0.7 K/uL   Basophils Relative 1  0 - 1 %   Basophils Absolute 0.0  0.0 - 0.1 K/uL  BASIC METABOLIC PANEL      Result Value Range   Sodium 140  137 - 147 mEq/L   Potassium 4.1  3.7 - 5.3 mEq/L   Chloride 99  96 - 112 mEq/L   CO2 27  19 - 32 mEq/L   Glucose, Bld 97  70 - 99 mg/dL   BUN 13  6 - 23 mg/dL   Creatinine, Ser 0.91  0.50 - 1.35 mg/dL   Calcium 9.4  8.4 - 10.5 mg/dL   GFR calc non Af Amer >90  >90 mL/min   GFR calc Af Amer >90  >90 mL/min  TROPONIN I      Result Value Range   Troponin I <0.30  <0.30 ng/mL   Dg Chest 2 View  05/07/2013   CLINICAL DATA:  Right-sided chest discomfort  EXAM: CHEST  2 VIEW  COMPARISON:  PA and lateral chest x-ray at May 05, 2013 and CT scan of the same day.  FINDINGS: The lungs are hypoinflated today. Persistent increased density is present adjacent to the minor  fissure on the right consistent with the known superior segment right lower lobe parenchymal mass. Haziness of the left hemidiaphragm likely reflects new subsegmental atelectasis. On the lateral film there is now mild blunting of both costophrenic angles. The cardiopericardial silhouette is top-normal in size. The pulmonary vascularity is not engorged. The mediastinum is normal in width. There is no pleural effusion.  IMPRESSION: Since the earlier study the lungs have become more hypoinflated and bibasilar atelectasis and small pleural effusions have developed and increased in conspicuity. The parenchymal mass in the right lower lobe is unchanged in appearance.   Electronically Signed   By: Jamorian  Martinique   On: 05/07/2013 14:15    Nm Pet Image Initial (pi) Skull Base To Thigh  05/02/2013   CLINICAL DATA:  Initial treatment strategy for staging of lung mass.  EXAM: NUCLEAR MEDICINE PET SKULL BASE TO THIGH  FASTING BLOOD GLUCOSE:  Value: 94mg /dl  TECHNIQUE: 19.4 mCi F-18 FDG was injected intravenously. CT data was obtained and used for attenuation correction and anatomic  localization only. (This was not acquired as a diagnostic CT examination.) Additional exam technical data entered on technologist worksheet.  COMPARISON:  CT CHEST W/ CM dated 04/21/2013; DG CHEST 2 VIEW dated 04/17/2013  FINDINGS: NECK  No areas of abnormal hypermetabolism.  CHEST  A perifissural spiculated right-sided lung nodule corresponds to hypermetabolism. This measures 2.6 x 2.4 cm and a S.U.V. max of 5.3 on image 102/series 2. This is centered in the superior segment right lower lobe, as on prior diagnostic CT.  No abnormal thoracic nodal activity.  ABDOMEN/PELVIS  No areas of abnormal hypermetabolism.  SKELETON  No abnormal marrow activity.  CT IMAGES PERFORMED FOR ATTENUATION CORRECTION  Minimal fluid in the sphenoid sinus. Mucosal thickening of the right maxillary sinus and ethmoid air cells.  Borderline cardiomegaly. Suspect Left Main  Coronary artery atherosclerosis on image 102/series 2. Partially incompletely rotated left kidney. Normal adrenal glands.  IMPRESSION: 1. Hypermetabolic superior segment right lower lobe lung nodule, most consistent with primary bronchogenic carcinoma. 2. No evidence of thoracic nodal or extrathoracic disease. Presuming non-small-cell histology, T1bN0M0 or stage IA. 3. Coronary artery atherosclerosis, likely within the left main. This is age advanced. Correlate with risk factors and consider medical therapy. 4. Incidental findings, including sinus disease.   Electronically Signed   By: Abigail Miyamoto M.D.   On: 05/02/2013 17:01     Imaging Review Dg Chest 2 View  05/07/2013   CLINICAL DATA:  Right-sided chest discomfort  EXAM: CHEST  2 VIEW  COMPARISON:  PA and lateral chest x-ray at May 05, 2013 and CT scan of the same day.  FINDINGS: The lungs are hypoinflated today. Persistent increased density is present adjacent to the minor fissure on the right consistent with the known superior segment right lower lobe parenchymal mass. Haziness of the left hemidiaphragm likely reflects new subsegmental atelectasis. On the lateral film there is now mild blunting of both costophrenic angles. The cardiopericardial silhouette is top-normal in size. The pulmonary vascularity is not engorged. The mediastinum is normal in width. There is no pleural effusion.  IMPRESSION: Since the earlier study the lungs have become more hypoinflated and bibasilar atelectasis and small pleural effusions have developed and increased in conspicuity. The parenchymal mass in the right lower lobe is unchanged in appearance.   Electronically Signed   By: Taishawn  Martinique   On: 05/07/2013 14:15    EKG Interpretation   None       MDM   1. Pleurisy    Patient with recently diagnosed lung mass. He is scheduled for a biopsy via bronchoscopy on January 26. He has symptoms consistent with pleurisy. He had a recent CT scan 2 days ago which did  not show any evidence of a pulmonary embolus. There is no evidence of pneumonia. Patient has a followup appointment tomorrow with his pulmonologist, Dr. Annamaria Boots. He can continue taking his pain medication and return as needed for any worsening symptoms.    Malvin Johns, MD 05/07/13 [phone]

## 2013-05-07 NOTE — ED Notes (Signed)
Patient returned from X-ray 

## 2013-05-08 ENCOUNTER — Encounter: Payer: Self-pay | Admitting: Internal Medicine

## 2013-05-08 ENCOUNTER — Ambulatory Visit (INDEPENDENT_AMBULATORY_CARE_PROVIDER_SITE_OTHER): Payer: 59 | Admitting: Internal Medicine

## 2013-05-08 VITALS — BP 140/90 | HR 76 | Ht 72.0 in | Wt 259.2 lb

## 2013-05-08 DIAGNOSIS — R918 Other nonspecific abnormal finding of lung field: Secondary | ICD-10-CM

## 2013-05-08 DIAGNOSIS — R079 Chest pain, unspecified: Secondary | ICD-10-CM

## 2013-05-08 DIAGNOSIS — R222 Localized swelling, mass and lump, trunk: Secondary | ICD-10-CM

## 2013-05-08 MED ORDER — OXYCODONE-ACETAMINOPHEN 5-325 MG PO TABS: 1.0000 | ORAL_TABLET | Freq: Four times a day (QID) | ORAL | Status: DC | PRN

## 2013-05-08 MED ORDER — FENTANYL 50 MCG/HR TD PT72: 50.0000 ug | MEDICATED_PATCH | TRANSDERMAL | Status: DC

## 2013-05-08 NOTE — Assessment & Plan Note (Addendum)
Increasing complaints of right chest wall pain in the area where the nodule is seen to extend to the pleura. I'm afraid this means some form of chest wall involvement, periosteal or nerve sheath, although no PET activity is seen there.  He is very uncomfortable about scheduling delay for bx.  With hx of eosinophilia, benign disease is not excluded in this [name] smoker. We have not tried steroids for therapeutic response. Consider eosinophilic granulomatosis, Loffler's, eosinophilic pneumonia.  Concern is active pain and desire not to delay Rx. Plan- Direct referral to Thoracic Surgery. He may be candidate for resection without preop bx. If not, he is still on Dr Agustina Caroli bronch schedule. Refill Percocet, add Rx for fentanyl patch, with education.

## 2013-05-08 NOTE — Progress Notes (Signed)
04/25/13- 21 yoM never smoker  Referred courtesy of Dr Annie Sable Chest-mass on right lung Wife here He grew up in a smoking family, but has no prior hx of lung disease. He and his family shared an acute viral bronchitis "chest cold" in November. He noted shortness of breath after that on stairs and playing golf, but attributed this to 25 lb weight gain in last few years.CXR ?'d pneumonia and Rx'd w/ abx. F/u 5 weeks later with Repeat CXR showed persistent density R mid lung.  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 He reports no cough blood, nodes, chest pain, sweats or fever now "not sick", but still a little short of breath with exertion. 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  ECHO 11.21.14- EF 60-65%. Gr1 diast dysfunction. CXR 04/18/13- IMPRESSION:  Persistent rounded opacity in the right perihilar region, worrisome  for a nodule or mass. CT chest with contrast is recommended in  further evaluation. These results will be called to the ordering  clinician or representative by the Radiologist Assistant, and  communication documented in the PACS Dashboard.  Electronically Signed  By: Lorin Picket M.D.  On: 04/17/2013 09:39 Lab review- no hx anemia. Prior eosinophilia >10%.  Office spirometry 04/25/13- Mild obstructive airways disease. FVC 4.28/ 81%, FEV1 3.19/ 75%, FEV1/FVC 0.74, FEF25-75% 2.14/ 0.52  05/08/13- 48 yo never smoker with RLL nodule, chest wall pain     Wife here FOLLOWS FOR:  c/o:  constant pain (R) side. Taking small short breaths due to pain when breathing Here with wife to review status. He is currently scheduled for EBUS bx of RLL nodule but has been extremely uncomfortable because of pleuritic right lateral chest wall pain. We reviewed the CT and PET images showing dendritic extension from the nodule to a tenting density on the right pleura, but no PET  uptake in the chest wall and no apparent metastasis. Percocet is providing incomplete pain control. He is also getting constipated from the narcotic. Over the weekend he was evaluated at [facility] and is referred back here. I am told that scheduled time limitation prevents earlier EBUS bronch. Proximity to fissures caused recommendation against attempted needle biopsy. I discussed with him the possibility of direct referral to thoracic surgery to discuss therapeutic resection without biopsy. PET 05/02/13 IMPRESSION:  1. Hypermetabolic superior segment right lower lobe lung nodule,  most consistent with primary bronchogenic carcinoma.  2. No evidence of thoracic nodal or extrathoracic disease. Presuming  non-small-cell histology, T1bN0M0 or stage IA.  3. Coronary artery atherosclerosis, likely within the left main.  This is age advanced. Correlate with risk factors and consider  medical therapy.  4. Incidental findings, including sinus disease.  Electronically Signed  By: Abigail Miyamoto M.D.  On: 05/02/2013 17:01  ROS-see HPI Constitutional:   No-   weight loss, night sweats, fevers, chills, fatigue, lassitude. HEENT:   No-  headaches, difficulty swallowing, tooth/dental problems, sore throat,       No-  sneezing, itching, ear ache, nasal congestion, post nasal drip,  CV:  + R  chest pain, no-orthopnea, PND, swelling in lower extremities, anasarca,  dizziness, palpitations Resp: +shortness of breath with exertion or at rest.              No-   productive cough,  No non-productive cough,  No- coughing up of blood.              No-   change in color of mucus.  No- wheezing.   Skin: No-   rash or lesions. GI:  No-   heartburn, indigestion, abdominal pain, nausea, vomiting,                +change in bowel habits,  GU:  MS:  No-   joint pain or swelling.   Neuro-     nothing unusual Psych:  No- change in mood or affect. No depression or anxiety.  No  memory loss.  OBJ- Physical Exam General- Alert, Oriented, Affect-appropriate, Distress- none acute. Appears well now. Skin- rash-none, lesions- none, excoriation- none Lymphadenopathy- none Head- atraumatic            Eyes- Gross vision intact, PERRLA, conjunctivae and secretions clear            Ears- Hearing, canals-normal            Nose- Clear, no-Septal dev, mucus, polyps, erosion, perforation             Throat- Mallampati II , mucosa clear , drainage- none, tonsils- atrophic Neck- flexible , trachea midline, no stridor , thyroid nl, carotid no bruit Chest - symmetrical excursion , unlabored           Heart/CV- RRR , no murmur , no gallop  , no rub, nl s1 s2                           - JVD- none , edema- none, stasis changes- none, varices- none           Lung- clear to P&A, wheeze- none, cough- none , dullness-none, rub- none           Chest wall-  Abd-  Br/ Gen/ Rectal- Not done, not indicated Extrem- cyanosis- none, clubbing, none, atrophy- none, strength- nl Neuro- grossly intact to observation

## 2013-05-08 NOTE — Patient Instructions (Addendum)
Script refilling pain pills  Script for fetanyl pain patches- use as directed  Order- referral consultation to Thoracic surgery   Dx lung mass with chest pain    Start daily Colace or Miralax otc to avoid constipation from narcotics

## 2013-05-09 ENCOUNTER — Other Ambulatory Visit: Payer: Self-pay | Admitting: *Deleted

## 2013-05-09 ENCOUNTER — Encounter: Payer: Self-pay | Admitting: Thoracic Surgery (Cardiothoracic Vascular Surgery)

## 2013-05-09 ENCOUNTER — Institutional Professional Consult (permissible substitution) (INDEPENDENT_AMBULATORY_CARE_PROVIDER_SITE_OTHER): Payer: 59 | Admitting: Thoracic Surgery (Cardiothoracic Vascular Surgery)

## 2013-05-09 VITALS — BP 136/84 | HR 69 | Resp 16 | Ht 73.0 in | Wt 250.0 lb

## 2013-05-09 DIAGNOSIS — R911 Solitary pulmonary nodule: Secondary | ICD-10-CM

## 2013-05-09 DIAGNOSIS — R918 Other nonspecific abnormal finding of lung field: Secondary | ICD-10-CM

## 2013-05-09 NOTE — Telephone Encounter (Signed)
Pt has an appt with CY on 05-08-13. [place] Bing, CMA

## 2013-05-09 NOTE — Progress Notes (Signed)
PCP is [name], MD Referring Provider is Deneise Lever, MD  Chief Complaint  Patient presents with  . Lung Mass    RLLobe...eval and treat....CT/PET...has only had basic spirometry in the office    HPI: 48 year old gentleman sent for evaluation for surgery for a right lower lobe mass.  Mr. Ouzts is a 48 year old nonsmoker. He was in his usual state of health until September when he had a cold. In November he said he was starting to get a little short of breath and had a general lack of energy. He saw Dr. Ronette Deter a chest x-ray showed an opacity. He was treated with antibiotics and a repeat film is scheduled for 5 weeks later. That a chest x-ray showed an no change in the right lower lobe opacity. A CT was done which showed a right lower lobe mass in the superior segment at the confluence of the fissures. There was some thickening of the visceral pleura in the vicinity of the mass. A PET CT was done which showed this lesion to be hypermetabolic. There was no evidence of hilar or mediastinal adenopathy or activity. There was mention of possible plaque in the left main on the PET CT however. Last Thursday he developed right-sided chest pain. He describes as starting in his right side and radiating forward. It is a sharp pain that is very intense. It is worse with some movements and with breathing. He had a repeat chest CT which once again demonstrated the mass.  He has not had a cough or hemoptysis. He does feel short of breath and feels like it will hurt if he takes a deep breath. He says he gained about 25 pounds over the past year. He has no family history of lung cancer. He does have some secondhand tobacco exposure but has never smoked. [place] was not [place] around that time. He has no other known exposures   Past Medical History  Diagnosis Date  . Hyperlipidemia     takes Crestor daily  . Polyp of colon     Dr. Candace Cruise at [facility]  .  Thyroid nodule     small polyp on thyroid  . GERD (gastroesophageal reflux disease)     takes Omeprazole daily  . Depression     takes Celexa daily  . Atherosclerosis   . Sinus infection     takes Augmentin daily  . Shortness of breath     with exertion  . History of bronchitis     Aug 2014  . History of gout   . Sleep apnea     uses a cpap;sleep study done about a yr and a half ago    Past Surgical History  Procedure Laterality Date  . Nasal septum surgery  7-21yrs ago    Dr. Carlis Abbott  . Meniscus repair  2012    left knee, Dr. Judi Cong at [facility]  . Tonsillectomy and adenoidectomy  1973    adenoids as a child  . Cardiac catheterization  11/2007    [facility]  . Colonoscopy    . Esophagogastroduodenoscopy      Family History  Problem Relation Age of Onset  . Heart disease Father     s/p CABG  . Hypertension Father   . Diabetes Father   . Hyperlipidemia Father   . Hyperlipidemia Maternal Grandmother   . Hypertension Maternal Grandmother   . Hyperlipidemia Maternal Grandfather   . Heart disease Maternal Grandfather   .  Hypertension Maternal Grandfather   . Cancer Other     cancer  . Diabetes Other   . Heart disease Maternal Uncle   . Heart disease Paternal Uncle   . Heart disease Paternal Grandfather   . Healthy Mother     Social History History  Substance Use Topics  . Smoking status: Never Smoker   . Smokeless tobacco: Not on file  . Alcohol Use: Yes     Comment: nighlty    Current Outpatient Prescriptions  Medication Sig Dispense Refill  . amoxicillin-clavulanate (AUGMENTIN) 875-125 MG per tablet Take 1 tablet by mouth 2 (two) times daily.  20 tablet  0  . citalopram (CELEXA) 20 MG tablet Take 20 mg by mouth daily.      . diazepam (VALIUM) 5 MG tablet Take 5 mg by mouth every 6 (six) hours as needed for anxiety.      . fluticasone (FLONASE) 50 MCG/ACT nasal spray Place 2 sprays into both nostrils daily.      Marland Kitchen omeprazole (PRILOSEC) 20 MG capsule Take 1  capsule (20 mg total) by mouth daily.  30 capsule  3  . oxyCODONE-acetaminophen (PERCOCET/ROXICET) 5-325 MG per tablet Take 1-2 tablets by mouth every 6 (six) hours as needed for moderate pain or severe pain.  30 tablet  0  . rosuvastatin (CRESTOR) 20 MG tablet Take 20 mg by mouth daily.      . valACYclovir (VALTREX) 500 MG tablet Take 500 mg by mouth daily.      Marland Kitchen aspirin EC 81 MG tablet Take 81 mg by mouth daily.      . fentaNYL (DURAGESIC) 50 MCG/HR Place 1 patch (50 mcg total) onto the skin every 3 (three) days.  5 patch  0   No current facility-administered medications for this visit.    Allergies  Allergen Reactions  . Morphine And Related     Passed out    Review of Systems  Constitutional: Positive for activity change (lack of energy) and unexpected weight change (Weight gain, 25 pounds in one year).  HENT: Positive for congestion.   Eyes:       Floaters  Respiratory: Positive for apnea (Uses CPAP at night) and shortness of breath (at rest, with exertion, when lying flat).   Cardiovascular: Positive for chest pain (Right sided, pleuritic).  Gastrointestinal:       Reflux  All other systems reviewed and are negative.    BP 136/84  Pulse 69  Resp 16  Ht 6\' 1"  (1.854 m)  Wt 250 lb (113.399 kg)  BMI 32.99 kg/m2  SpO2 96% Physical Exam  Vitals reviewed. Constitutional: He is oriented to person, place, and time. He appears well-developed and well-nourished. No distress.  Very anxious  HENT:  Head: Normocephalic and atraumatic.  Eyes: EOM are normal. Pupils are equal, round, and reactive to light.  Neck: Neck supple. No thyromegaly present.  Cardiovascular: Normal rate, regular rhythm, normal heart sounds and intact distal pulses.  Exam reveals no gallop and no friction rub.   No murmur heard. Pulmonary/Chest: Effort normal and breath sounds normal. He has no wheezes. He has no rales.  Abdominal: Soft. There is no tenderness.  Musculoskeletal: He exhibits no edema.   Lymphadenopathy:    He has no cervical adenopathy.  Neurological: He is alert and oriented to person, place, and time. No cranial nerve deficit.  Normal motor function bilaterally  Skin: Skin is warm and dry.     Diagnostic Tests: NUCLEAR MEDICINE PET SKULL BASE  TO THIGH  FASTING BLOOD GLUCOSE: Value: 94mg /dl  TECHNIQUE:  19.4 mCi F-18 FDG was injected intravenously. CT data was obtained  and used for attenuation correction and anatomic localization only.  (This was not acquired as a diagnostic CT examination.) Additional  exam technical data entered on technologist worksheet.  COMPARISON: CT CHEST W/ CM dated 04/21/2013; DG CHEST 2 VIEW dated  04/17/2013  FINDINGS:  NECK  No areas of abnormal hypermetabolism.  CHEST  A perifissural spiculated right-sided lung nodule corresponds to  hypermetabolism. This measures 2.6 x 2.4 cm and a S.U.V. max of 5.3  on image 102/series 2. This is centered in the superior segment  right lower lobe, as on prior diagnostic CT.  No abnormal thoracic nodal activity.  ABDOMEN/PELVIS  No areas of abnormal hypermetabolism.  SKELETON  No abnormal marrow activity.  CT IMAGES PERFORMED FOR ATTENUATION CORRECTION  Minimal fluid in the sphenoid sinus. Mucosal thickening of the right  maxillary sinus and ethmoid air cells.  Borderline cardiomegaly. Suspect Left Main Coronary artery  atherosclerosis on image 102/series 2. Partially incompletely  rotated left kidney. Normal adrenal glands.  IMPRESSION:  1. Hypermetabolic superior segment right lower lobe lung nodule,  most consistent with primary bronchogenic carcinoma.  2. No evidence of thoracic nodal or extrathoracic disease. Presuming  non-small-cell histology, T1bN0M0 or stage IA.  3. Coronary artery atherosclerosis, likely within the left main.  This is age advanced. Correlate with risk factors and consider  medical therapy.  4. Incidental findings, including sinus disease.  Electronically Signed   By: Abigail Miyamoto M.D.  On: 05/02/2013 17:01  CT chest 05/06/2011  \IMPRESSION: No evidence of pulmonary arterial embolism. Known previously described mass right lung did consistent with bronchogenic carcinoma as described on prior CT scan as well as on January 13th PET scan. Interval development of tiny right pleural effusion as well as dependent atelectasis.  Electronically Signed By: Skipper Cliche M.D. On: 05/05/2013 19:43   Impression: 48 year old nonsmoker who has a 2.6 x 2.4 cm right lower lobe mass at the confluence of the major and minor fissures. This lesion is hypermetabolic on PET. It has a rather unusual appearance and does seem to be crossing the fissures at some points. That would be suspicious for possible malignancy. There is some thickening of the parietal pleura in the region of the mass possibly some fluid in the fissures. That finally would seem to suggest this might be infectious or inflammatory in nature.  He is very anxious to have something done about this as he feels that he may begin worsened and "the cancer" might be spreading throughout his body. I reassured him that although he is having pain, it would be appropriate to approach this in an organized fashion. He currently is scheduled to have a bronchoscopic biopsy on Monday the 26th. He also is scheduled to see Dr. Fletcher Anon regarding the left main plaque noted on the PET scan on Tuesday the 27th. My preference would be that he proceed with biopsy the we have a better idea of what we may be dealing with at the time of surgery. He definitely would need to see Dr. Fletcher Anon and be cleared from a cardiology standpoint prior to undergoing a possible major lung resection.  I did discussed with him the proposed operation, should it be necessary. We discussed proceeding with a right VATS, wedge resection, possible lobectomy (or pneumonectomy), possible chest wall resection. He understands the general nature of the procedure, the  intraoperative decision making, the incisions to  be used, the expectations for postoperative pain, expected stay and overall recovery. He understands the risk include but are not limited to death, MI, DVT, PE, bleeding, possible need for transfusion, infection, irregular heart rhythms, respiratory failure, other organ system dysfunction including renal or GI complications.  As it stands we have scheduled him tentatively for February 2. We will await the findings from Dr. Agustina Caroli biopsies and Dr. Tyrell Antonio evaluation before making a final decision whether to proceed.  Plan: See above

## 2013-05-10 ENCOUNTER — Other Ambulatory Visit: Payer: Self-pay | Admitting: Internal Medicine

## 2013-05-10 DIAGNOSIS — R918 Other nonspecific abnormal finding of lung field: Secondary | ICD-10-CM

## 2013-05-11 ENCOUNTER — Telehealth: Payer: Self-pay | Admitting: Internal Medicine

## 2013-05-11 NOTE — Telephone Encounter (Signed)
Returning call can be reached at [phone].Elnita Maxwell

## 2013-05-11 NOTE — Telephone Encounter (Signed)
Records faxed.  Spoke with pt and he is picking up disc of PET and CT and taking with him.  Pt is getting a 2nd opinion at [facility] but does not want to cancel bronch or surgery that is scheduled until he goes through with 2nd opinion.

## 2013-05-12 ENCOUNTER — Telehealth: Payer: Self-pay | Admitting: Emergency Medicine

## 2013-05-12 ENCOUNTER — Other Ambulatory Visit: Payer: Self-pay | Admitting: Internal Medicine

## 2013-05-12 ENCOUNTER — Other Ambulatory Visit: Payer: Self-pay | Admitting: *Deleted

## 2013-05-12 NOTE — Telephone Encounter (Signed)
Thank you :)

## 2013-05-12 NOTE — Telephone Encounter (Signed)
Noted  

## 2013-05-12 NOTE — Telephone Encounter (Signed)
Spoke with spouse. She was calling to cancel pt bronch on Monday. Pt is currently at [facility] and will do bronch and have surgery on wed. She is needing to cancel bronch for Monday and the surgery scheduled for 05/22/13 since all this will be done at [facility] now. Resp is already closed to cancel bronch. I spoke with [facility] and advised her pt is cancelling surgery with [facility] as well. Will call Monday to cancel bronch. Made rhonda aware of this as well. Will forward to [place] as an [place]

## 2013-05-15 ENCOUNTER — Encounter (HOSPITAL_COMMUNITY): Payer: Self-pay | Admitting: Critical Care Medicine

## 2013-05-15 ENCOUNTER — Encounter (HOSPITAL_COMMUNITY): Admission: RE | Payer: Self-pay | Source: Ambulatory Visit

## 2013-05-15 ENCOUNTER — Encounter: Payer: Self-pay | Admitting: Adult Health

## 2013-05-15 ENCOUNTER — Ambulatory Visit (HOSPITAL_COMMUNITY): Admission: RE | Admit: 2013-05-15 | Payer: 59 | Source: Ambulatory Visit | Admitting: Emergency Medicine

## 2013-05-15 ENCOUNTER — Other Ambulatory Visit: Payer: Self-pay | Admitting: Internal Medicine

## 2013-05-15 SURGERY — BRONCHOSCOPY, WITH FLUOROSCOPY
Anesthesia: General | Laterality: Right

## 2013-05-15 MED ORDER — EPHEDRINE SULFATE 50 MG/ML IJ SOLN
INTRAMUSCULAR | Status: AC
  Filled 2013-05-15: qty 1

## 2013-05-15 MED ORDER — SODIUM CHLORIDE 0.9 % IJ SOLN
INTRAMUSCULAR | Status: AC
  Filled 2013-05-15: qty 10

## 2013-05-15 MED ORDER — MIDAZOLAM HCL 2 MG/2ML IJ SOLN
INTRAMUSCULAR | Status: AC
  Filled 2013-05-15: qty 2

## 2013-05-15 MED ORDER — OXYCODONE-ACETAMINOPHEN 5-325 MG PO TABS: 1.0000 | ORAL_TABLET | Freq: Four times a day (QID) | ORAL | Status: DC | PRN

## 2013-05-15 MED ORDER — FENTANYL CITRATE 0.05 MG/ML IJ SOLN
INTRAMUSCULAR | Status: AC
  Filled 2013-05-15: qty 5

## 2013-05-15 MED ORDER — ARTIFICIAL TEARS OP OINT
TOPICAL_OINTMENT | OPHTHALMIC | Status: AC
  Filled 2013-05-15: qty 3.5

## 2013-05-15 MED ORDER — PROPOFOL 10 MG/ML IV BOLUS
INTRAVENOUS | Status: AC
  Filled 2013-05-15: qty 20

## 2013-05-16 ENCOUNTER — Encounter: Payer: Self-pay | Admitting: Cardiovascular Disease

## 2013-05-16 ENCOUNTER — Ambulatory Visit (INDEPENDENT_AMBULATORY_CARE_PROVIDER_SITE_OTHER): Payer: 59 | Admitting: Cardiovascular Disease

## 2013-05-16 VITALS — BP 122/82 | HR 71 | Ht 72.0 in | Wt 255.8 lb

## 2013-05-16 VITALS — BP 122/82 | HR 89 | Ht 72.0 in | Wt 255.8 lb

## 2013-05-16 DIAGNOSIS — R000 Tachycardia, unspecified: Secondary | ICD-10-CM

## 2013-05-16 DIAGNOSIS — R0609 Other forms of dyspnea: Secondary | ICD-10-CM

## 2013-05-16 DIAGNOSIS — E785 Hyperlipidemia, unspecified: Secondary | ICD-10-CM

## 2013-05-16 DIAGNOSIS — R0600 Dyspnea, unspecified: Secondary | ICD-10-CM

## 2013-05-16 DIAGNOSIS — R0989 Other specified symptoms and signs involving the circulatory and respiratory systems: Secondary | ICD-10-CM

## 2013-05-16 NOTE — Patient Instructions (Signed)
Your stress test is normal.  Follow up as needed.

## 2013-05-16 NOTE — Patient Instructions (Signed)
Your physician has requested that you have an exercise tolerance test. For further information please visit [email or web address]. Please also follow instruction sheet, as given.   

## 2013-05-16 NOTE — Procedures (Signed)
   Treadmill Stress test  Indication: Exertional dyspnea.  Baseline Data:  Resting EKG shows NSR with rate of 83 bpm, no significant ST or T wave changes. Resting blood pressure of 115/78 mm Hg Stand bruce protocal was used.  Exercise Data:  Patient exercised for 9 min 0 sec,  Peak heart rate of 160 bpm.  This was 92 % of the maximum predicted heart rate. No symptoms of chest pain or lightheadedness were reported at peak stress or in recovery.  Peak Blood pressure recorded was 158/78 Maximal work level: 10.1 METs.  Heart rate at 3 minutes in recovery was 110 bpm. BP response: Normal HR response: Normal  EKG with Exercise: Sinus tachycardia with no significant ST changes.  FINAL IMPRESSION: Normal exercise stress test. No significant EKG changes concerning for ischemia. Good exercise tolerance. Low risk Duke treadmill score.  Recommendation: Continue aggressive treatment of risk factors. He is at low risk for the planned surgery.

## 2013-05-17 ENCOUNTER — Encounter: Payer: Self-pay | Admitting: Cardiovascular Disease

## 2013-05-17 DIAGNOSIS — R0600 Dyspnea, unspecified: Secondary | ICD-10-CM | POA: Insufficient documentation

## 2013-05-17 HISTORY — PX: LUNG CANCER SURGERY: SHX702

## 2013-05-17 NOTE — Progress Notes (Signed)
HPI  This is a pleasant 48 year old male who is here today for evaluation of exertional dyspnea and atypical chest discomfort. He has known history of hyperlipidemia and family history of coronary artery disease. He was recently diagnosed with right lower lobe lung mass and will be undergoing resection tomorrow at [facility]. He had previous cardiac catheterization in 2009 which showed normal coronary arteries with normal ejection fraction. It was not after an abnormal nuclear stress test. He has been having right-sided chest pain which is pleuritic in nature likely related to the lung mass. He also has noticed exertional dyspnea with moderate activities. No orthopnea, PND or lower extremity edema. He had a PET scan done which was suggestive of atherosclerosis involving the left main coronary artery.  Allergies  Allergen Reactions  . Morphine And Related     Passed out     Current Outpatient Prescriptions on File Prior to Visit  Medication Sig Dispense Refill  . amoxicillin-clavulanate (AUGMENTIN) 875-125 MG per tablet Take 1 tablet by mouth 2 (two) times daily.  20 tablet  0  . citalopram (CELEXA) 20 MG tablet Take 20 mg by mouth daily.      . fluticasone (FLONASE) 50 MCG/ACT nasal spray Place 2 sprays into both nostrils daily.      Marland Kitchen omeprazole (PRILOSEC) 20 MG capsule Take 1 capsule (20 mg total) by mouth daily.  30 capsule  3  . oxyCODONE-acetaminophen (PERCOCET/ROXICET) 5-325 MG per tablet Take 1-2 tablets by mouth every 6 (six) hours as needed for moderate pain or severe pain.  90 tablet  0  . rosuvastatin (CRESTOR) 20 MG tablet Take 20 mg by mouth daily.      . valACYclovir (VALTREX) 500 MG tablet Take 500 mg by mouth daily.      Marland Kitchen aspirin EC 81 MG tablet Take 81 mg by mouth daily.       No current facility-administered medications on file prior to visit.     Past Medical History  Diagnosis Date  . Hyperlipidemia     takes Crestor daily  . Polyp of colon     Dr. Candace Cruise at [facility]  .  Thyroid nodule     small polyp on thyroid  . GERD (gastroesophageal reflux disease)     takes Omeprazole daily  . Depression     takes Celexa daily  . Atherosclerosis   . Sinus infection     takes Augmentin daily  . Shortness of breath     with exertion  . History of bronchitis     Aug 2014  . History of gout   . Sleep apnea     uses a cpap;sleep study done about a yr and a half ago     Past Surgical History  Procedure Laterality Date  . Nasal septum surgery  7-73yrs ago    Dr. Carlis Abbott  . Meniscus repair  2012    left knee, Dr. Judi Cong at [facility]  . Tonsillectomy and adenoidectomy  1973    adenoids as a child  . Colonoscopy    . Esophagogastroduodenoscopy    . Cardiac catheterization  11/2007    [facility]: Normal coronary arteries.     Family History  Problem Relation Age of Onset  . Heart disease Father     s/p CABG  . Hypertension Father   . Diabetes Father   . Hyperlipidemia Father   . Heart attack Father   . Hyperlipidemia Maternal Grandmother   . Hypertension Maternal Grandmother   .  Hyperlipidemia Maternal Grandfather   . Heart disease Maternal Grandfather   . Hypertension Maternal Grandfather   . Cancer Other     cancer  . Diabetes Other   . Heart disease Maternal Uncle   . Heart disease Paternal Uncle   . Heart disease Paternal Grandfather   . Healthy Mother      History   Social History  . Marital Status: Married    Spouse Name: N/A    Number of Children: 2  . Years of Education: N/A   Occupational History  . sales    Social History Main Topics  . Smoking status: Never Smoker   . Smokeless tobacco: Not on file  . Alcohol Use: Yes     Comment: nighlty  . Drug Use: No  . Sexual Activity: Yes   Other Topics Concern  . Not on file   Social History Narrative   Lives in [place] with wife and 2 children, 19 and 16YO. Dogs in home.      Work - Geologist, engineering   Diet - healthy   Exercise - 22min cardio per day, trainer, golf      ROS A 10 point review of system was performed. It is negative other than that mentioned in the history of present illness.   PHYSICAL EXAM   BP 122/82  Pulse 71  Ht 6' (1.829 m)  Wt 255 lb 12 oz (116.007 kg)  BMI 34.68 kg/m2 Constitutional: He is oriented to person, place, and time. He appears well-developed and well-nourished. No distress.  HENT: No nasal discharge.  Head: Normocephalic and atraumatic.  Eyes: Pupils are equal and round.  No discharge. Neck: Normal range of motion. Neck supple. No JVD present. No thyromegaly present.  Cardiovascular: Normal rate, regular rhythm, normal heart sounds. Exam reveals no gallop and no friction rub. No murmur heard.  Pulmonary/Chest: Effort normal and breath sounds normal. No stridor. No respiratory distress. He has no wheezes. He has no rales. He exhibits no tenderness.  Abdominal: Soft. Bowel sounds are normal. He exhibits no distension. There is no tenderness. There is no rebound and no guarding.  Musculoskeletal: Normal range of motion. He exhibits no edema and no tenderness.  Neurological: He is alert and oriented to person, place, and time. Coordination normal.  Skin: Skin is warm and dry. No rash noted. He is not diaphoretic. No erythema. No pallor.  Psychiatric: He has a normal mood and affect. His behavior is normal. Judgment and thought content normal.       EKG: Normal sinus rhythm with no significant ST or T wave changes.   ASSESSMENT AND PLAN

## 2013-05-17 NOTE — Assessment & Plan Note (Signed)
His chest pain seems to be noncardiac and related to the lung mass. However, he has exertional dyspnea with recent evidence of atherosclerosis involving the left main coronary artery noted on PET scan. Due to his risk factors for coronary artery disease. I proceeded with a treadmill stress test. He was able to exercise for 9 minutes with no significant symptoms and no EKG changes. Based on that, he is at low risk from a cardiac standpoint for the planned surgery tomorrow. Continue treatment of risk factors. I discussed with the patient the importance of lifestyle changes in order to decrease the chance of future coronary artery disease and cardiovascular events. We discussed the importance of controlling risk factors, healthy diet as well as regular exercise. I also explained to him that a normal stress test does not rule out atherosclerosis.

## 2013-05-17 NOTE — Assessment & Plan Note (Signed)
I agree with a target LDL of less than 100. Lab Results  Component Value Date   CHOL 201* 10/03/2012   HDL 67.80 10/03/2012   LDLCALC 85 10/29/2011   LDLDIRECT 120.0 10/03/2012   TRIG 74.0 10/03/2012   CHOLHDL 3 10/03/2012

## 2013-05-18 ENCOUNTER — Other Ambulatory Visit (HOSPITAL_COMMUNITY): Payer: 59

## 2013-05-18 DIAGNOSIS — G8912 Acute post-thoracotomy pain: Secondary | ICD-10-CM | POA: Insufficient documentation

## 2013-05-22 ENCOUNTER — Inpatient Hospital Stay (HOSPITAL_COMMUNITY)
Admission: RE | Admit: 2013-05-22 | Payer: 59 | Source: Ambulatory Visit | Admitting: Thoracic Surgery (Cardiothoracic Vascular Surgery)

## 2013-05-22 ENCOUNTER — Encounter (HOSPITAL_COMMUNITY): Admission: RE | Payer: Self-pay | Source: Ambulatory Visit

## 2013-05-22 SURGERY — VIDEO ASSISTED THORACOSCOPY (VATS)/WEDGE RESECTION
Anesthesia: General | Site: Chest | Laterality: Right

## 2013-05-24 ENCOUNTER — Other Ambulatory Visit: Payer: Self-pay | Admitting: Internal Medicine

## 2013-05-25 ENCOUNTER — Encounter: Payer: 59 | Admitting: Internal Medicine

## 2013-05-30 ENCOUNTER — Other Ambulatory Visit: Payer: Self-pay | Admitting: Internal Medicine

## 2013-06-01 ENCOUNTER — Other Ambulatory Visit: Payer: Self-pay | Admitting: Internal Medicine

## 2013-06-05 ENCOUNTER — Other Ambulatory Visit: Payer: Self-pay | Admitting: Internal Medicine

## 2013-06-07 ENCOUNTER — Other Ambulatory Visit: Payer: Self-pay | Admitting: Internal Medicine

## 2013-06-07 MED ORDER — VALACYCLOVIR HCL 500 MG PO TABS: 1000.0000 mg | ORAL_TABLET | Freq: Three times a day (TID) | ORAL | Status: DC

## 2013-06-09 DIAGNOSIS — Z85118 Personal history of other malignant neoplasm of bronchus and lung: Secondary | ICD-10-CM | POA: Insufficient documentation

## 2013-06-19 ENCOUNTER — Encounter: Payer: Self-pay | Admitting: Adult Health

## 2013-06-19 ENCOUNTER — Ambulatory Visit (INDEPENDENT_AMBULATORY_CARE_PROVIDER_SITE_OTHER): Payer: 59 | Admitting: Adult Health

## 2013-06-19 VITALS — BP 118/68 | HR 82 | Temp 98.3°F | Resp 14 | Wt 243.0 lb

## 2013-06-19 DIAGNOSIS — M792 Neuralgia and neuritis, unspecified: Secondary | ICD-10-CM

## 2013-06-19 DIAGNOSIS — IMO0002 Reserved for concepts with insufficient information to code with codable children: Secondary | ICD-10-CM

## 2013-06-19 MED ORDER — GABAPENTIN 300 MG PO CAPS: 300.0000 mg | ORAL_CAPSULE | Freq: Two times a day (BID) | ORAL | Status: DC

## 2013-06-19 NOTE — Progress Notes (Signed)
   Subjective:    Patient ID: Robert Morse, male    DOB: 01/02/1966, 48 y.o.   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 He presents with pain on the left side of his lateral chest and left upper abdominal quadrant which he describes as burning, stinging pain and sensation of fullness. He denies fatigue, shortness of breath. He reports walking 1 mile on the treadmill yesterday without any problems. He reports that this pain has been ongoing since 05/30/12. It first began when he had an epidural. He reported this to his surgeon at [facility]; however, they did not think it was related to his epidural since his surgery was on the right. He recalls that we he awoke from surgery he felt greater fullness on his left side than the right. He recalls his left nipple being extremely sensitive. He also felt more numbness on the left than on the right. He was recently treated for shingles with valtrex thinking that this is what it was; however, he never developed any vesicles or lesions. He reports some improvement of his pain but it is still persistent.  Current Outpatient Prescriptions on File Prior to Visit  Medication Sig Dispense Refill  . aspirin EC 81 MG tablet Take 81 mg by mouth daily.      . citalopram (CELEXA) 20 MG tablet Take 20 mg by mouth daily.      . fluticasone (FLONASE) 50 MCG/ACT nasal spray Place 2 sprays into both nostrils daily.      Marland Kitchen omeprazole (PRILOSEC) 20 MG capsule TAKE ONE CAPSULE BY MOUTH EVERY DAY  30 capsule  5  . rosuvastatin (CRESTOR) 20 MG tablet Take 20 mg by mouth daily.      . valACYclovir (VALTREX) 500 MG tablet Take 2 tablets (1,000 mg total) by mouth 3 (three) times daily.  42 tablet  1   No current facility-administered medications on file prior to visit.     Review of Systems  Constitutional: Negative.  Negative for fever and fatigue.  Respiratory: Negative for cough, chest tightness and  shortness of breath.   Cardiovascular: Negative.   Gastrointestinal: Negative for nausea, vomiting, abdominal pain, diarrhea, constipation and abdominal distention.  Neurological: Positive for numbness (tingling, burning sensation left lateral chest wall to luq of abdomenn).  Psychiatric/Behavioral: Negative for confusion, decreased concentration and agitation. The patient is not nervous/anxious.   All other systems reviewed and are negative.       Objective:   Physical Exam  Constitutional: He is oriented to person, place, and time. He appears well-developed and well-nourished. No distress.  Cardiovascular: Normal rate and regular rhythm.   Pulmonary/Chest: Effort normal. He has no wheezes.  Abdominal: Soft. Bowel sounds are normal. He exhibits no distension and no mass. There is tenderness (superficial tenderness with palpation of LUQ). There is no rebound and no guarding.  Neurological: He is alert and oriented to person, place, and time.  Skin: Skin is warm and dry. No rash noted. No erythema.  Psychiatric: He has a normal mood and affect. His behavior is normal. Judgment and thought content normal.       Assessment & Plan:   1. Neuropathic pain Some improvement of symptoms. Suspect may be related to epidural he received for surgery. Start trial of gabapentin to see if symptoms improve further. Will increase dose if necessary.

## 2013-06-19 NOTE — Progress Notes (Signed)
Pre visit review using our clinic review tool, if applicable. No additional management support is needed unless otherwise documented below in the visit note. 

## 2013-06-19 NOTE — Patient Instructions (Signed)
  Take Neurontin 300 mg tonight at bedtime.  Tomorrow start 300 mg in the morning and 300 mg at bedtime.

## 2013-06-29 ENCOUNTER — Encounter: Payer: Self-pay | Admitting: *Deleted

## 2013-07-14 ENCOUNTER — Ambulatory Visit (INDEPENDENT_AMBULATORY_CARE_PROVIDER_SITE_OTHER): Payer: 59 | Admitting: Internal Medicine

## 2013-07-14 ENCOUNTER — Encounter: Payer: Self-pay | Admitting: Emergency Medicine

## 2013-07-14 ENCOUNTER — Telehealth: Payer: Self-pay | Admitting: *Deleted

## 2013-07-14 ENCOUNTER — Encounter: Payer: Self-pay | Admitting: Internal Medicine

## 2013-07-14 VITALS — BP 128/88 | HR 78 | Temp 98.2°F | Ht 71.0 in | Wt 242.0 lb

## 2013-07-14 DIAGNOSIS — M542 Cervicalgia: Secondary | ICD-10-CM

## 2013-07-14 DIAGNOSIS — L989 Disorder of the skin and subcutaneous tissue, unspecified: Secondary | ICD-10-CM

## 2013-07-14 DIAGNOSIS — C3430 Malignant neoplasm of lower lobe, unspecified bronchus or lung: Secondary | ICD-10-CM

## 2013-07-14 DIAGNOSIS — Z0000 Encounter for general adult medical examination without abnormal findings: Secondary | ICD-10-CM | POA: Insufficient documentation

## 2013-07-14 DIAGNOSIS — B351 Tinea unguium: Secondary | ICD-10-CM

## 2013-07-14 DIAGNOSIS — Z23 Encounter for immunization: Secondary | ICD-10-CM | POA: Insufficient documentation

## 2013-07-14 LAB — CBC WITH DIFFERENTIAL/PLATELET
BASOS PCT: 1 % (ref 0.0–3.0)
Basophils Absolute: 0.1 10*3/uL (ref 0.0–0.1)
EOS PCT: 8.8 % — AB (ref 0.0–5.0)
Eosinophils Absolute: 0.5 10*3/uL (ref 0.0–0.7)
HEMATOCRIT: 43 % (ref 39.0–52.0)
Hemoglobin: 14.8 g/dL (ref 13.0–17.0)
LYMPHS PCT: 23.2 % (ref 12.0–46.0)
Lymphs Abs: 1.3 10*3/uL (ref 0.7–4.0)
MCHC: 34.4 g/dL (ref 30.0–36.0)
MCV: 88 fl (ref 78.0–100.0)
Monocytes Absolute: 0.4 10*3/uL (ref 0.1–1.0)
Monocytes Relative: 7.2 % (ref 3.0–12.0)
Neutro Abs: 3.4 10*3/uL (ref 1.4–7.7)
Neutrophils Relative %: 59.8 % (ref 43.0–77.0)
Platelets: 320 10*3/uL (ref 150.0–400.0)
RBC: 4.89 Mil/uL (ref 4.22–5.81)
RDW: 13.6 % (ref 11.5–14.6)
WBC: 5.7 10*3/uL (ref 4.5–10.5)

## 2013-07-14 LAB — POCT URINALYSIS DIPSTICK
Bilirubin, UA: NEGATIVE
Blood, UA: NEGATIVE
Glucose, UA: NEGATIVE
Ketones, UA: NEGATIVE
Leukocytes, UA: NEGATIVE
Nitrite, UA: NEGATIVE
Protein, UA: NEGATIVE
Spec Grav, UA: 1.015
Urobilinogen, UA: 0.2
pH, UA: 5.5

## 2013-07-14 LAB — COMPREHENSIVE METABOLIC PANEL
ALT: 41 U/L (ref 0–53)
AST: 24 U/L (ref 0–37)
Albumin: 4.5 g/dL (ref 3.5–5.2)
Alkaline Phosphatase: 57 U/L (ref 39–117)
BUN: 17 mg/dL (ref 6–23)
CALCIUM: 10 mg/dL (ref 8.4–10.5)
CHLORIDE: 101 meq/L (ref 96–112)
CO2: 25 mEq/L (ref 19–32)
Creatinine, Ser: 0.9 mg/dL (ref 0.4–1.5)
GFR: 98.19 mL/min (ref >60.00)
GLUCOSE: 93 mg/dL (ref 70–99)
Potassium: 4.2 mEq/L (ref 3.5–5.1)
Sodium: 137 mEq/L (ref 135–145)
Total Bilirubin: 0.6 mg/dL (ref 0.3–1.2)
Total Protein: 7.5 g/dL (ref 6.0–8.3)

## 2013-07-14 LAB — LIPID PANEL
CHOL/HDL RATIO: 3
CHOLESTEROL: 198 mg/dL (ref 0–200)
HDL: 67.8 mg/dL (ref >39.00)
LDL CALC: 109 mg/dL — AB (ref 0–99)
Triglycerides: 104 mg/dL (ref 0.0–149.0)
VLDL: 20.8 mg/dL (ref 0.0–40.0)

## 2013-07-14 LAB — MICROALBUMIN / CREATININE URINE RATIO
CREATININE, U: 122.5 mg/dL
MICROALB UR: 2 mg/dL — AB (ref 0.0–1.9)
MICROALB/CREAT RATIO: 1.6 mg/g (ref 0.0–30.0)

## 2013-07-14 MED ORDER — MELOXICAM 15 MG PO TABS: 15.0000 mg | ORAL_TABLET | Freq: Every day | ORAL | Status: DC

## 2013-07-14 NOTE — Assessment & Plan Note (Signed)
S/p thoracotomy at [facility]. Doing well. Reviewed notes today from [place]. Plan for follow up CXR in 07/2013 and follow up CT in 10/2013.

## 2013-07-14 NOTE — Patient Instructions (Signed)
Look into coverage for Pneumovax.

## 2013-07-14 NOTE — Assessment & Plan Note (Signed)
No improvement with previous laser treatment. Will set up evaluation with [facility]. Would prefer to avoid use of Lamisil given current use of Crestor.

## 2013-07-14 NOTE — Progress Notes (Signed)
Pre visit review using our clinic review tool, if applicable. No additional management support is needed unless otherwise documented below in the visit note. 

## 2013-07-14 NOTE — Assessment & Plan Note (Signed)
General medical exam normal today. Encouraged healthy diet and regular exercise, as tolerated. Will check CBC, CMP, lipids with labs. Pneumovax given today given h/o thoracotomy.

## 2013-07-14 NOTE — Progress Notes (Signed)
Subjective:    Patient ID: Robert Morse, male    DOB: April 21, 1965, 48 y.o.   MRN: [id number]  HPI 48YO male presents for annual exam.  S/p right thoracotomy 05/2013 for right lung mass. Pathology showed adenocarcinoma.. Having some left chest wall pain and right chest wall pain at sight of incision, however symptoms have improved over last few weeks..  Also notes some pain in left lower/mid back. Worsened with taking a deep breath. Notes some shortness of breath, improving after surgery. No fever, chills. Has some dry cough 4-5 times daily. No urinary symptoms.  Walking on treadmill >64mile with no problems.  Pain in back of neck x 1-2 weeks. Occurs with physical activity, particularly intercourse. Improves within 30 sec with stopping activity. No visual changes. No focal neurologic symptoms.  He is also concerned about chronic thickening of right great toenail. He has had laser treatment of this in the past with no improvement.   Review of Systems  Constitutional: Negative for fever, chills, activity change, appetite change, fatigue and unexpected weight change.  Eyes: Negative for visual disturbance.  Respiratory: Negative for cough and shortness of breath.   Cardiovascular: Positive for chest pain. Negative for palpitations and leg swelling.  Gastrointestinal: Negative for abdominal pain and abdominal distention.  Genitourinary: Negative for dysuria, urgency and difficulty urinating.  Musculoskeletal: Positive for back pain, myalgias and neck pain. Negative for arthralgias, gait problem and neck stiffness.  Skin: Negative for color change and rash.  Hematological: Negative for adenopathy.  Psychiatric/Behavioral: Negative for sleep disturbance and dysphoric mood. The patient is not nervous/anxious.        Objective:    BP 128/88  Pulse 78  Temp(Src) 98.2 F (36.8 C) (Oral)  Ht 5\' 11"  (1.803 m)  Wt 242 lb (109.77 kg)  BMI 33.77 kg/m2  SpO2 97% Physical Exam    Constitutional: He is oriented to person, place, and time. He appears well-developed and well-nourished. No distress.  HENT:  Head: Normocephalic and atraumatic.  Right Ear: External ear normal.  Left Ear: External ear normal.  Nose: Nose normal.  Mouth/Throat: Oropharynx is clear and moist. No oropharyngeal exudate.  Eyes: Conjunctivae and EOM are normal. Pupils are equal, round, and reactive to light. Right eye exhibits no discharge. Left eye exhibits no discharge. No scleral icterus.  Neck: Normal range of motion. Neck supple. No tracheal deviation present. No thyromegaly present.  Cardiovascular: Normal rate, regular rhythm and normal heart sounds.  Exam reveals no gallop and no friction rub.   No murmur heard. Pulmonary/Chest: Effort normal. No accessory muscle usage. Not tachypneic. No respiratory distress. He has decreased breath sounds in the right lower field. He has no wheezes. He has no rhonchi. He has no rales. He exhibits tenderness (slight tenderness over incision site). He exhibits no bony tenderness.    Abdominal: Soft. Bowel sounds are normal. He exhibits no distension and no mass. There is no tenderness. There is no rebound and no guarding.  Musculoskeletal: Normal range of motion. He exhibits no edema.  Lymphadenopathy:    He has no cervical adenopathy.  Neurological: He is alert and oriented to person, place, and time. No cranial nerve deficit. Coordination normal.  Skin: Skin is warm and dry. No rash noted. He is not diaphoretic. No erythema. No pallor.  Psychiatric: He has a normal mood and affect. His behavior is normal. Judgment and thought content normal.          Assessment & Plan:   Problem List  Items Addressed This Visit   Cancer of lower lobe of lung     S/p thoracotomy at [facility]. Doing well. Reviewed notes today from [place]. Plan for follow up CXR in 07/2013 and follow up CT in 10/2013.     Need for prophylactic vaccination against Streptococcus pneumoniae  (pneumococcus)   Relevant Orders      Pneumococcal polysaccharide vaccine 23-valent greater than or equal to 2yo subcutaneous/IM (Completed)   Posterior neck pain     Most likely intermittent muscular strain. However, given h/o atherosclerosis, will check carotid dopplers.    Relevant Orders      Carotid duplex   Routine general medical examination at a health care facility - Primary     General medical exam normal today. Encouraged healthy diet and regular exercise, as tolerated. Will check CBC, CMP, lipids with labs. Pneumovax given today given h/o thoracotomy.    Relevant Orders      CBC with Differential      Comprehensive metabolic panel      Lipid panel      Microalbumin / creatinine urine ratio      Vit D  25 hydroxy (rtn osteoporosis monitoring)      POCT urinalysis dipstick (Completed)   Skin lesion     Several dark nevi noted over trunk. Will set up dermatology evaluation for screening for skin cancer.    Relevant Orders      Ambulatory referral to Dermatology   Toenail fungus     No improvement with previous laser treatment. Will set up evaluation with [place]. Would prefer to avoid use of Lamisil given current use of Crestor.    Relevant Orders      Ambulatory referral to Podiatry       Return in about 6 months (around 01/14/2014).

## 2013-07-14 NOTE — Assessment & Plan Note (Signed)
Several dark nevi noted over trunk. Will set up dermatology evaluation for screening for skin cancer.

## 2013-07-14 NOTE — Assessment & Plan Note (Signed)
Most likely intermittent muscular strain. However, given h/o atherosclerosis, will check carotid dopplers.

## 2013-07-14 NOTE — Telephone Encounter (Signed)
[facility] sent fax over requesting meloxicam 15 mg #30 3 refills.

## 2013-07-15 LAB — VITAMIN D 25 HYDROXY (VIT D DEFICIENCY, FRACTURES): Vit D, 25-Hydroxy: 35 ng/mL (ref 30–89)

## 2013-07-20 ENCOUNTER — Encounter: Payer: Self-pay | Admitting: Internal Medicine

## 2013-07-20 ENCOUNTER — Telehealth: Payer: Self-pay | Admitting: *Deleted

## 2013-07-20 DIAGNOSIS — I7090 Unspecified atherosclerosis: Secondary | ICD-10-CM | POA: Insufficient documentation

## 2013-07-20 NOTE — Telephone Encounter (Signed)
This will not work for a carotid artery ultrasound, bruie, stroke like symptoms, headache, dizziness, blurred vision, syncope. They could do a carotid screening if you can not find a dx and it will be a $50 charge to the patient.

## 2013-07-20 NOTE — Telephone Encounter (Signed)
Olivia Mackie will call to inform patient of this, if any questions she will direct him to call our office for further information

## 2013-07-20 NOTE — Telephone Encounter (Signed)
OK. He has none of the other symptoms. So will need to pay 50 dollar fee for screening carotid doppler.

## 2013-07-20 NOTE — Telephone Encounter (Signed)
Her manager said they can not use headache because it will not qualify, what are the reasons this was requested, please give more detailed diagnosis.

## 2013-07-20 NOTE — Telephone Encounter (Signed)
Yes she need it be noted in his chart please

## 2013-07-20 NOTE — Telephone Encounter (Signed)
Robert Morse from [facility] called in reference to patient coming in for a carotid ultrasound but the diagnosis code will not work. Would like for you to put something in the chart they can use for the purpose of diagnosis for ultrasound.

## 2013-07-20 NOTE — Telephone Encounter (Signed)
Can we use atherosclerosis?

## 2013-07-20 NOTE — Telephone Encounter (Signed)
Headache

## 2013-07-25 ENCOUNTER — Other Ambulatory Visit (HOSPITAL_COMMUNITY): Payer: Self-pay | Admitting: *Deleted

## 2013-07-25 ENCOUNTER — Ambulatory Visit (INDEPENDENT_AMBULATORY_CARE_PROVIDER_SITE_OTHER): Payer: 59

## 2013-07-25 DIAGNOSIS — Z0000 Encounter for general adult medical examination without abnormal findings: Secondary | ICD-10-CM

## 2013-07-25 DIAGNOSIS — Z139 Encounter for screening, unspecified: Secondary | ICD-10-CM

## 2013-07-31 ENCOUNTER — Encounter: Payer: Self-pay | Admitting: Podiatry

## 2013-07-31 ENCOUNTER — Ambulatory Visit (INDEPENDENT_AMBULATORY_CARE_PROVIDER_SITE_OTHER): Payer: 59 | Admitting: Podiatry

## 2013-07-31 VITALS — Resp 16 | Ht 72.0 in | Wt 245.0 lb

## 2013-07-31 DIAGNOSIS — L608 Other nail disorders: Secondary | ICD-10-CM

## 2013-07-31 NOTE — Progress Notes (Signed)
He presents today concerned with this thick and discolored nails the hallux left. States that he has had been laser in the past and it did not work. He states no one has sampled them.  Objective: Vital signs are stable he is alert and oriented x3. Nails 1 through 5 of the left foot. He dystrophic and rule out onychomycosis.  Assessment: Nail dystrophy  Plan: Samples were taken of nails 1 through 5 of the left foot today and the surrounding skin.

## 2013-08-20 ENCOUNTER — Other Ambulatory Visit: Payer: Self-pay | Admitting: Adult Health

## 2013-09-20 ENCOUNTER — Other Ambulatory Visit: Payer: Self-pay | Admitting: Internal Medicine

## 2013-09-26 ENCOUNTER — Telehealth: Payer: Self-pay | Admitting: *Deleted

## 2013-09-26 NOTE — Telephone Encounter (Signed)
Called and left message to call office to set up appt to see dr Milinda Pointer to go over lab results.

## 2013-09-27 ENCOUNTER — Encounter: Payer: Self-pay | Admitting: Podiatry

## 2013-10-18 ENCOUNTER — Other Ambulatory Visit: Payer: Self-pay | Admitting: Internal Medicine

## 2013-11-14 ENCOUNTER — Other Ambulatory Visit: Payer: Self-pay | Admitting: Internal Medicine

## 2014-01-09 ENCOUNTER — Other Ambulatory Visit: Payer: Self-pay | Admitting: Internal Medicine

## 2014-02-02 ENCOUNTER — Telehealth: Payer: Self-pay | Admitting: Internal Medicine

## 2014-02-02 ENCOUNTER — Encounter: Payer: Self-pay | Admitting: Internal Medicine

## 2014-02-02 ENCOUNTER — Other Ambulatory Visit: Payer: Self-pay | Admitting: Internal Medicine

## 2014-02-02 ENCOUNTER — Ambulatory Visit (INDEPENDENT_AMBULATORY_CARE_PROVIDER_SITE_OTHER)
Admission: RE | Admit: 2014-02-02 | Discharge: 2014-02-02 | Disposition: A | Discharge status: Home / Self Care | Payer: 59 | Source: Ambulatory Visit | Attending: Internal Medicine | Admitting: Internal Medicine

## 2014-02-02 ENCOUNTER — Ambulatory Visit: Payer: Self-pay | Admitting: Internal Medicine

## 2014-02-02 ENCOUNTER — Ambulatory Visit (INDEPENDENT_AMBULATORY_CARE_PROVIDER_SITE_OTHER): Payer: 59 | Admitting: Internal Medicine

## 2014-02-02 VITALS — BP 120/78 | HR 89 | Temp 98.1°F | Ht 71.0 in | Wt 246.8 lb

## 2014-02-02 DIAGNOSIS — Z85118 Personal history of other malignant neoplasm of bronchus and lung: Secondary | ICD-10-CM

## 2014-02-02 DIAGNOSIS — R05 Cough: Secondary | ICD-10-CM

## 2014-02-02 DIAGNOSIS — Z23 Encounter for immunization: Secondary | ICD-10-CM

## 2014-02-02 DIAGNOSIS — R059 Cough, unspecified: Secondary | ICD-10-CM

## 2014-02-02 MED ORDER — PANTOPRAZOLE SODIUM 40 MG PO TBEC: 40.0000 mg | DELAYED_RELEASE_TABLET | Freq: Two times a day (BID) | ORAL | Status: DC

## 2014-02-02 NOTE — Telephone Encounter (Signed)
November 2nd at 3:15

## 2014-02-02 NOTE — Progress Notes (Signed)
Pre visit review using our clinic review tool, if applicable. No additional management support is needed unless otherwise documented below in the visit note. 

## 2014-02-02 NOTE — Progress Notes (Signed)
Subjective:    Patient ID: Robert Morse, male    DOB: 04/09/1966, 48 y.o.   MRN: [id number]  HPI 48YO male presents for acute visit.  Cough - Dry cough for several months. Occurs with talking. Feels "choked" on liquids when swallowing. Also having pain in right chest wall. Pain improves after vomiting. Feels short of breath when having right sided chest pressure, but this too resolves with vomiting. Occasional loose stool. No abdominal pain. Taking Omeprazole.   Review of Systems  Constitutional: Negative for fever, chills, activity change, appetite change, fatigue and unexpected weight change.  Eyes: Negative for visual disturbance.  Respiratory: Positive for cough and choking. Negative for chest tightness, shortness of breath and wheezing.   Cardiovascular: Positive for chest pain (right chest wall). Negative for palpitations and leg swelling.  Gastrointestinal: Positive for nausea, vomiting and diarrhea. Negative for abdominal pain, constipation and abdominal distention.  Genitourinary: Negative for dysuria, urgency and difficulty urinating.  Musculoskeletal: Negative for arthralgias and gait problem.  Skin: Negative for color change and rash.  Hematological: Negative for adenopathy.  Psychiatric/Behavioral: Negative for sleep disturbance and dysphoric mood. The patient is not nervous/anxious.        Objective:    BP 120/78  Pulse 89  Temp(Src) 98.1 F (36.7 C) (Oral)  Ht 5\' 11"  (1.803 m)  Wt 246 lb 12 oz (111.925 kg)  BMI 34.43 kg/m2  SpO2 97% Physical Exam  Constitutional: He is oriented to person, place, and time. He appears well-developed and well-nourished. No distress.  HENT:  Head: Normocephalic and atraumatic.  Right Ear: External ear normal.  Left Ear: External ear normal.  Nose: Nose normal.  Mouth/Throat: Oropharynx is clear and moist. No oropharyngeal exudate.  Eyes: Conjunctivae and EOM are normal. Pupils are equal, round, and reactive to light. Right  eye exhibits no discharge. Left eye exhibits no discharge. No scleral icterus.  Neck: Normal range of motion. Neck supple. No tracheal deviation present. No thyromegaly present.  Cardiovascular: Normal rate, regular rhythm and normal heart sounds.  Exam reveals no gallop and no friction rub.   No murmur heard. Pulmonary/Chest: Effort normal and breath sounds normal. No accessory muscle usage. Not tachypneic. No respiratory distress. He has no decreased breath sounds. He has no wheezes. He has no rhonchi. He has no rales. He exhibits no tenderness.  Musculoskeletal: Normal range of motion. He exhibits no edema.  Lymphadenopathy:    He has no cervical adenopathy.  Neurological: He is alert and oriented to person, place, and time. No cranial nerve deficit. Coordination normal.  Skin: Skin is warm and dry. No rash noted. He is not diaphoretic. No erythema. No pallor.  Psychiatric: He has a normal mood and affect. His behavior is normal. Judgment and thought content normal.          Assessment & Plan:   Problem List Items Addressed This Visit     Unprioritized   Cough - Primary     Several months history of cough and right lower chest pain. Symptoms are most consistent with reflux. Will change Omeprazole to Pantoprazole. Will set up UGI for further evaluation. Reviewed CT chest from 10/2013 which was stable except for small nodule RUL. CXR today. Follow up in 2 weeks.    Relevant Medications      pantoprazole (PROTONIX) EC tablet   Other Relevant Orders      H. pylori breath test      DG Chest 2 View  DG UGI W/Small Bowel   History of lung cancer     Reviewed previous CT chest from 10/2013 which was stable except for 68mm nodule RUL. Will repeat CXR today given cough. We discussed possibly repeating CT if CXR inconclusive.     Other Visit Diagnoses   Encounter for immunization            Return in about 2 weeks (around 02/16/2014).

## 2014-02-02 NOTE — Telephone Encounter (Signed)
Please advise 

## 2014-02-02 NOTE — Assessment & Plan Note (Signed)
Several months history of cough and right lower chest pain. Symptoms are most consistent with reflux. Will change Omeprazole to Pantoprazole. Will set up UGI for further evaluation. Reviewed CT chest from 10/2013 which was stable except for small nodule RUL. CXR today. Follow up in 2 weeks.

## 2014-02-02 NOTE — Patient Instructions (Signed)
Stop Omeprazole. Start Pantoprazole twice daily.  Chest xray today at [facility].  Upper GI study to be scheduled.  Follow up 2 weeks.

## 2014-02-02 NOTE — Telephone Encounter (Signed)
Pt needs 30 min 2 week follow up. Please advise where to add pt to schedule.msn

## 2014-02-02 NOTE — Assessment & Plan Note (Signed)
Reviewed previous CT chest from 10/2013 which was stable except for 59mm nodule RUL. Will repeat CXR today given cough. We discussed possibly repeating CT if CXR inconclusive.

## 2014-02-03 ENCOUNTER — Telehealth: Payer: Self-pay | Admitting: Internal Medicine

## 2014-02-03 NOTE — Telephone Encounter (Signed)
Attempted to call pt with CT chest results. Left voicemail to call office back. CT chest showed soft tissue thickening in the surgical staple lines in the area of previous surgery. We may ultimately want to get a PET CT for further evaluation, but for now I would recommend proceeding with the upper GI study that was ordered. I would also like to set up an earlier follow up with the [place] [facility]. This way he can review the upper GI and CT results and decide if he would like to do any additional testing

## 2014-02-04 LAB — H. PYLORI BREATH TEST: H. pylori Breath Test: NOT DETECTED

## 2014-02-05 NOTE — Telephone Encounter (Signed)
Spoke with pt via email

## 2014-02-06 ENCOUNTER — Encounter: Payer: Self-pay | Admitting: Internal Medicine

## 2014-02-08 ENCOUNTER — Encounter: Payer: Self-pay | Admitting: Internal Medicine

## 2014-02-08 ENCOUNTER — Ambulatory Visit (INDEPENDENT_AMBULATORY_CARE_PROVIDER_SITE_OTHER): Payer: 59 | Admitting: Internal Medicine

## 2014-02-08 VITALS — BP 122/72 | HR 123 | Temp 100.7°F | Resp 18 | Wt 240.8 lb

## 2014-02-08 DIAGNOSIS — J209 Acute bronchitis, unspecified: Secondary | ICD-10-CM

## 2014-02-08 DIAGNOSIS — R05 Cough: Secondary | ICD-10-CM

## 2014-02-08 DIAGNOSIS — Z85118 Personal history of other malignant neoplasm of bronchus and lung: Secondary | ICD-10-CM

## 2014-02-08 DIAGNOSIS — R059 Cough, unspecified: Secondary | ICD-10-CM

## 2014-02-08 MED ORDER — AMOXICILLIN-POT CLAVULANATE 875-125 MG PO TABS: 1.0000 | ORAL_TABLET | Freq: Two times a day (BID) | ORAL | Status: DC

## 2014-02-08 MED ORDER — LEVOFLOXACIN 750 MG PO TABS: 750.0000 mg | ORAL_TABLET | Freq: Every day | ORAL | Status: DC

## 2014-02-08 MED ORDER — HYDROCOD POLST-CHLORPHEN POLST 10-8 MG/5ML PO LQCR: 5.0000 mL | Freq: Two times a day (BID) | ORAL | Status: DC | PRN

## 2014-02-08 NOTE — Assessment & Plan Note (Signed)
Likely viral infection leading to current symptoms, however given history of right lower lobectomy and recent reflux issues, will cover empirically for secondary bacterial infection with Augmentin. Hydrocodone for cough. If symptoms are not improving over next 24-48hr, he will need re-eval with chest CT.

## 2014-02-08 NOTE — Progress Notes (Signed)
Subjective:    Patient ID: Robert Morse, male    DOB: January 01, 1966, 48 y.o.   MRN: [id number]  HPI 48YO male presents for acute visit.  Seen on 10/16 for cough. CXR showed RLL opacity. CT chest showed likely scaring from previous surgery no acute infection. Felt better over the weekend.  Over the last few days, however, cough has worsened. Having some fever at home and post-tussive emesis. Feels dizzy at times with cough. Tried Robitussin for cough with no improvement. Feels tired. Short of breath with exertion. Generally achy. Several coworkers sick with similar symptoms.  Tolerated a [place] sandwich for lunch today.   Review of Systems  Constitutional: Positive for fever, chills and fatigue. Negative for activity change.  HENT: Positive for congestion. Negative for ear discharge, ear pain, hearing loss, nosebleeds, postnasal drip, rhinorrhea, sinus pressure, sneezing, sore throat, tinnitus, trouble swallowing and voice change.   Eyes: Negative for discharge, redness, itching and visual disturbance.  Respiratory: Positive for cough and shortness of breath. Negative for chest tightness, wheezing and stridor.   Cardiovascular: Negative for chest pain and leg swelling.  Gastrointestinal: Positive for vomiting (with cough). Negative for abdominal pain, diarrhea and constipation.  Musculoskeletal: Negative for arthralgias, myalgias, neck pain and neck stiffness.  Skin: Negative for color change and rash.  Neurological: Negative for dizziness, facial asymmetry and headaches.  Psychiatric/Behavioral: Negative for sleep disturbance.       Objective:    BP 122/72  Pulse 123  Temp(Src) 100.7 F (38.2 C) (Oral)  Resp 18  Wt 240 lb 12 oz (109.203 kg)  SpO2 98% Physical Exam  Constitutional: He is oriented to person, place, and time. He appears well-developed and well-nourished. No distress.  HENT:  Head: Normocephalic and atraumatic.  Right Ear: External ear normal.  Left Ear:  External ear normal.  Nose: Nose normal.  Mouth/Throat: Oropharynx is clear and moist. No oropharyngeal exudate.  Eyes: Conjunctivae and EOM are normal. Pupils are equal, round, and reactive to light. Right eye exhibits no discharge. Left eye exhibits no discharge. No scleral icterus.  Neck: Normal range of motion. Neck supple. No tracheal deviation present. No thyromegaly present.  Cardiovascular: Normal rate, regular rhythm and normal heart sounds.  Exam reveals no gallop and no friction rub.   No murmur heard. Pulmonary/Chest: Effort normal. No accessory muscle usage. Not tachypneic. No respiratory distress. He has decreased breath sounds (at site of lobectomy) in the right lower field. He has no wheezes. He has rhonchi (scattered throughout). He has no rales. He exhibits no tenderness.  Musculoskeletal: Normal range of motion. He exhibits no edema.  Lymphadenopathy:    He has no cervical adenopathy.  Neurological: He is alert and oriented to person, place, and time. No cranial nerve deficit. Coordination normal.  Skin: Skin is warm and dry. No rash noted. He is not diaphoretic. No erythema. No pallor.  Psychiatric: He has a normal mood and affect. His behavior is normal. Judgment and thought content normal.          Assessment & Plan:   Problem List Items Addressed This Visit     Unprioritized   Acute bronchitis - Primary     Likely viral infection leading to current symptoms, however given history of right lower lobectomy and recent reflux issues, will cover empirically for secondary bacterial infection with Augmentin. Hydrocodone for cough. If symptoms are not improving over next 24-48hr, he will need re-eval with chest CT.    Relevant Medications  chlorpheniramine-HYDROcodone (TUSSIONEX) suspension 8-10 mg/43mL      AMOXICILLIN-POT CLAVULANATE 875-125 MG PO TABS   Cough   History of lung cancer     S/p right lower lobectomy at [place] last year. Recent Chest CT showed likely  scarring at site of surgery. Plan for follow up with CT surgeon at [facility]. May ultimately need PET CT to ensure no local recurrence.        Return in about 2 weeks (around 02/22/2014) for Recheck.

## 2014-02-08 NOTE — Patient Instructions (Signed)
Start Augmentin twice daily.  Use Ibuprofen 600-800mg  up to three times daily as needed for pain or fever.  Use Tussionex as needed for cough.  Increase intake of fluids.   Follow up in 2 weeks or sooner as needed.

## 2014-02-08 NOTE — Progress Notes (Signed)
Pre visit review using our clinic review tool, if applicable. No additional management support is needed unless otherwise documented below in the visit note. 

## 2014-02-08 NOTE — Assessment & Plan Note (Signed)
S/p right lower lobectomy at [place] last year. Recent Chest CT showed likely scarring at site of surgery. Plan for follow up with CT surgeon at [facility]. May ultimately need PET CT to ensure no local recurrence.

## 2014-02-19 ENCOUNTER — Ambulatory Visit (INDEPENDENT_AMBULATORY_CARE_PROVIDER_SITE_OTHER)
Admission: RE | Admit: 2014-02-19 | Discharge: 2014-02-19 | Disposition: A | Discharge status: Home / Self Care | Payer: 59 | Source: Ambulatory Visit | Attending: Internal Medicine | Admitting: Internal Medicine

## 2014-02-19 ENCOUNTER — Ambulatory Visit (INDEPENDENT_AMBULATORY_CARE_PROVIDER_SITE_OTHER): Payer: 59 | Admitting: Internal Medicine

## 2014-02-19 ENCOUNTER — Encounter: Payer: Self-pay | Admitting: Internal Medicine

## 2014-02-19 ENCOUNTER — Telehealth: Payer: Self-pay | Admitting: Internal Medicine

## 2014-02-19 VITALS — BP 118/72 | HR 82 | Temp 98.0°F | Resp 14 | Ht 71.0 in | Wt 241.5 lb

## 2014-02-19 DIAGNOSIS — Z85118 Personal history of other malignant neoplasm of bronchus and lung: Secondary | ICD-10-CM

## 2014-02-19 DIAGNOSIS — R05 Cough: Secondary | ICD-10-CM

## 2014-02-19 DIAGNOSIS — R059 Cough, unspecified: Secondary | ICD-10-CM

## 2014-02-19 MED ORDER — HYDROCOD POLST-CHLORPHEN POLST 10-8 MG/5ML PO LQCR: 5.0000 mL | Freq: Two times a day (BID) | ORAL | Status: DC | PRN

## 2014-02-19 MED ORDER — DOXYCYCLINE HYCLATE 100 MG PO TABS: 100.0000 mg | ORAL_TABLET | Freq: Two times a day (BID) | ORAL | Status: DC

## 2014-02-19 NOTE — Telephone Encounter (Signed)
Robert Morse said Dr. Gilford Rile wants to see him in 2-4wks but I didn't see anything on the schedule within that time. He's currently scheduled on 04-05-14. He's wondering if he can come in sooner. Please call the patient. Thank you.

## 2014-02-19 NOTE — Assessment & Plan Note (Signed)
Call placed to Dr. Erie Noe, left message. Will try to set up earlier follow up given possible progression of disease seen on CT Chest.

## 2014-02-19 NOTE — Assessment & Plan Note (Signed)
Cough is improving but not completely resolved. Exam is remarkable for decreased breath sounds right lung. Will repeat CXR today. Start Doxycycline. Called pt thoracic surgeon to set up earlier follow up. Question if PET CT might be helpful for further evaluation of potential scarring versus extension of disease in RLL on CT.

## 2014-02-19 NOTE — Patient Instructions (Signed)
Start Doxycycline 100mg  twice daily.  Chest xray at [facility] today.  We will call Dr. Erie Noe to set up earlier follow up.

## 2014-02-19 NOTE — Progress Notes (Signed)
Subjective:    Patient ID: Robert Morse, male    DOB: [birth year], 48 y.o.   MRN: [id number]  HPI 48YO male with history of lung cancer presents for follow up.  Recently treated for possible bronchitis with Augmentin. Finished course of antibiotics. Cough has improved some and mucous has cleared. No recurrent fever. Continues to feel short of breath, particularly with exertion. Feels tired. Has some bilateral anterior chest pain.   Review of Systems  Constitutional: Positive for fatigue. Negative for fever, chills, activity change, appetite change and unexpected weight change.  Eyes: Negative for visual disturbance.  Respiratory: Positive for cough, chest tightness and shortness of breath.   Cardiovascular: Positive for chest pain (anterior ribs bilaterally). Negative for palpitations and leg swelling.  Gastrointestinal: Negative for abdominal pain and abdominal distention.  Genitourinary: Negative for dysuria, urgency and difficulty urinating.  Musculoskeletal: Negative for arthralgias and gait problem.  Skin: Negative for color change and rash.  Hematological: Negative for adenopathy.  Psychiatric/Behavioral: Negative for sleep disturbance and dysphoric mood. The patient is not nervous/anxious.        Objective:    BP 118/72 mmHg  Pulse 82  Temp(Src) 98 F (36.7 C) (Oral)  Resp 14  Ht 5\' 11"  (1.803 m)  Wt 241 lb 8 oz (109.544 kg)  BMI 33.70 kg/m2  SpO2 97% Physical Exam  Constitutional: He is oriented to person, place, and time. He appears well-developed and well-nourished. No distress.  HENT:  Head: Normocephalic and atraumatic.  Right Ear: External ear normal.  Left Ear: External ear normal.  Nose: Nose normal.  Mouth/Throat: Oropharynx is clear and moist. No oropharyngeal exudate.  Eyes: Conjunctivae and EOM are normal. Pupils are equal, round, and reactive to light. Right eye exhibits no discharge. Left eye exhibits no discharge. No scleral icterus.  Neck:  Normal range of motion. Neck supple. No tracheal deviation present. No thyromegaly present.  Cardiovascular: Normal rate, regular rhythm and normal heart sounds.  Exam reveals no gallop and no friction rub.   No murmur heard. Pulmonary/Chest: Effort normal. No accessory muscle usage. No tachypnea. No respiratory distress. He has decreased breath sounds in the right upper field, the right middle field and the right lower field. He has no wheezes. He has rhonchi (few scattered). He has no rales. He exhibits no tenderness.  Musculoskeletal: Normal range of motion. He exhibits no edema.  Lymphadenopathy:    He has no cervical adenopathy.  Neurological: He is alert and oriented to person, place, and time. No cranial nerve deficit. Coordination normal.  Skin: Skin is warm and dry. No rash noted. He is not diaphoretic. No erythema. No pallor.  Psychiatric: He has a normal mood and affect. His behavior is normal. Judgment and thought content normal.          Assessment & Plan:   Problem List Items Addressed This Visit      Unprioritized   Cough - Primary    Cough is improving but not completely resolved. Exam is remarkable for decreased breath sounds right lung. Will repeat CXR today. Start Doxycycline. Called pt thoracic surgeon to set up earlier follow up. Question if PET CT might be helpful for further evaluation of potential scarring versus extension of disease in RLL on CT.    Relevant Orders      DG Chest 2 View   History of lung cancer    Call placed to Dr. Erie Noe, left message. Will try to set up earlier follow up given possible  progression of disease seen on CT Chest.        Return in about 2 weeks (around 03/05/2014).

## 2014-02-19 NOTE — Telephone Encounter (Signed)
Mon Nov 16th at 3:30pm

## 2014-02-19 NOTE — Progress Notes (Signed)
Pre visit review using our clinic review tool, if applicable. No additional management support is needed unless otherwise documented below in the visit note. 

## 2014-02-20 NOTE — Telephone Encounter (Signed)
The patient has been scheduled

## 2014-03-05 ENCOUNTER — Ambulatory Visit: Payer: 59 | Admitting: Internal Medicine

## 2014-03-06 ENCOUNTER — Other Ambulatory Visit: Payer: Self-pay | Admitting: Internal Medicine

## 2014-03-09 ENCOUNTER — Other Ambulatory Visit: Payer: Self-pay | Admitting: Internal Medicine

## 2014-03-09 MED ORDER — HYDROCOD POLST-CHLORPHEN POLST 10-8 MG/5ML PO LQCR: 5.0000 mL | Freq: Two times a day (BID) | ORAL | Status: DC | PRN

## 2014-03-14 ENCOUNTER — Encounter: Payer: Self-pay | Admitting: Internal Medicine

## 2014-03-26 ENCOUNTER — Telehealth: Payer: Self-pay | Admitting: Internal Medicine

## 2014-03-26 ENCOUNTER — Ambulatory Visit: Payer: Self-pay | Admitting: Internal Medicine

## 2014-03-26 NOTE — Telephone Encounter (Signed)
Upper GI study was normal. Given that cough is persistent, I would like to set up evaluation with pulmonary medicine for further evaluation. Please ask him who he would like to see. Here locally, I would recommend Dr. Stevenson Clinch or Dr. Lake Bells

## 2014-03-26 NOTE — Telephone Encounter (Signed)
Pt states he has an appt on 12/18 with a Pulmonologist at [facility]

## 2014-03-29 ENCOUNTER — Other Ambulatory Visit: Payer: Self-pay | Admitting: Internal Medicine

## 2014-03-29 NOTE — Telephone Encounter (Signed)
Last Ov 11.2.15, last refill 11.17.15.  please advise refill

## 2014-04-03 ENCOUNTER — Encounter: Payer: Self-pay | Admitting: Internal Medicine

## 2014-04-05 ENCOUNTER — Ambulatory Visit: Payer: 59 | Admitting: Internal Medicine

## 2014-04-16 ENCOUNTER — Telehealth: Payer: Self-pay | Admitting: *Deleted

## 2014-04-16 NOTE — Telephone Encounter (Signed)
Pt's asking for refill on cough medication, cough is not getting any better,  Pt had bronchoscopy done at [facility] today.

## 2014-04-17 ENCOUNTER — Other Ambulatory Visit: Payer: Self-pay | Admitting: *Deleted

## 2014-04-17 MED ORDER — HYDROCOD POLST-CHLORPHEN POLST 10-8 MG/5ML PO LQCR: 5.0000 mL | Freq: Two times a day (BID) | ORAL | Status: DC | PRN

## 2014-04-17 NOTE — Telephone Encounter (Signed)
I tried to review results of bronchoscopy from [place] but they are not yet available. We can refill Tussionex x1

## 2014-04-17 NOTE — Telephone Encounter (Signed)
Notified pt that Rx is ready for pick up, Put Rx  in folder

## 2014-04-26 DIAGNOSIS — C3491 Malignant neoplasm of unspecified part of right bronchus or lung: Secondary | ICD-10-CM | POA: Insufficient documentation

## 2014-05-26 ENCOUNTER — Other Ambulatory Visit: Payer: Self-pay | Admitting: Internal Medicine

## 2014-07-20 ENCOUNTER — Telehealth: Payer: Self-pay

## 2014-07-20 NOTE — Telephone Encounter (Signed)
07/20/14 Disc Received from [facility] and filed on shelf.Britt Bottom

## 2014-08-20 ENCOUNTER — Other Ambulatory Visit: Payer: Self-pay | Admitting: Internal Medicine

## 2014-10-04 ENCOUNTER — Other Ambulatory Visit: Payer: Self-pay | Admitting: Internal Medicine

## 2014-10-04 NOTE — Telephone Encounter (Signed)
Sent mychart on need for appt

## 2014-10-18 NOTE — Telephone Encounter (Signed)
Mailed unread message to pt  

## 2014-10-29 IMAGING — CT CT ANGIO CHEST
2 of 6 series · 18 of 36 positions shown · IV contrast (APPLIED)
Comparison: Chest radiograph performed today and CT thorax
performed April 21, 2013

CLINICAL DATA: Right-sided rib pain in right upper quadrant pain
for 1-1/2 on eighth, sharp pleuritic pain

EXAM:
CT ANGIOGRAPHY CHEST WITH CONTRAST
TECHNIQUE: Multidetector CT imaging of the chest was performed using the
standard protocol during bolus administration of intravenous
contrast. Multiplanar CT image reconstructions including MIPs were
obtained to evaluate the vascular anatomy.
CONTRAST:  100 mL Isovue 370

[Series 5: pe 1.0 thins · axial · 0.83mm/px · z∈[-36,+186]mm · 17 of 250 slices shown]
[im 14/250  lung]
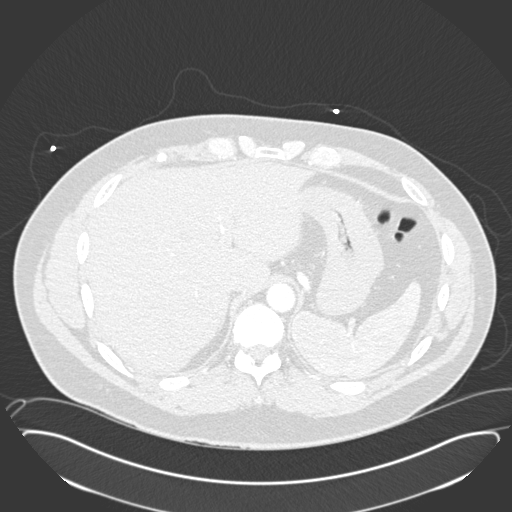
[im 28/250  mediastinal]
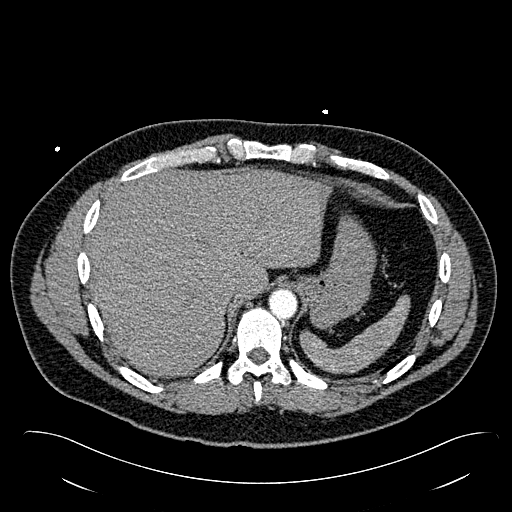
[im 42/250  lung]
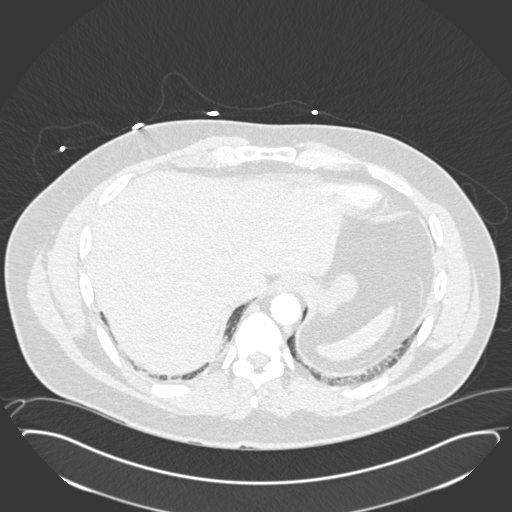
[im 56/250  mediastinal]
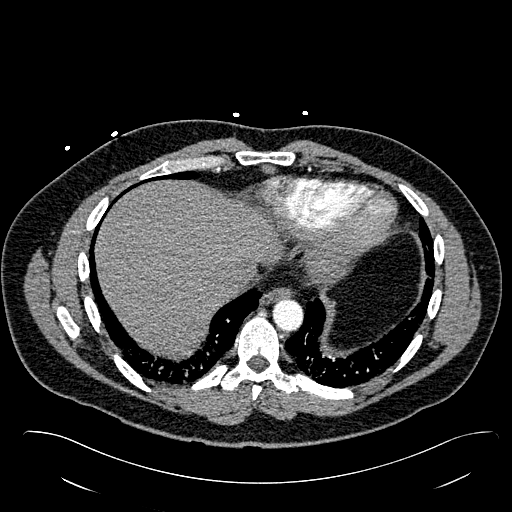
[im 70/250  lung]
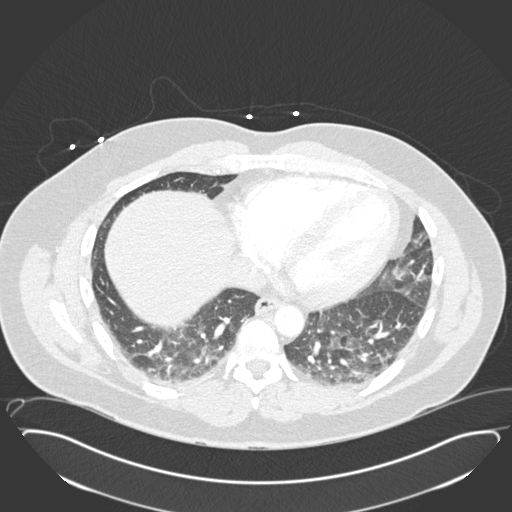
[im 84/250  mediastinal]
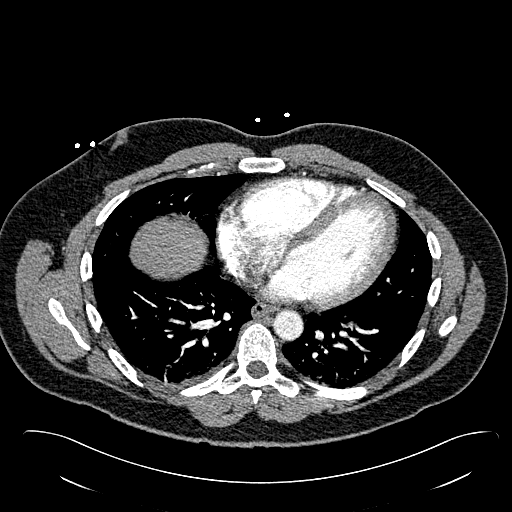
[im 97/250  lung]
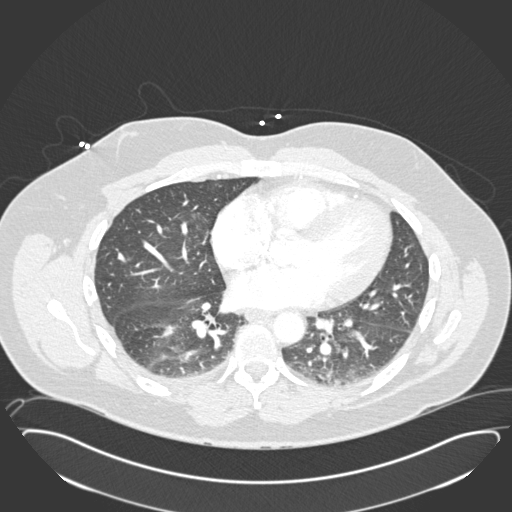
[im 111/250  mediastinal]
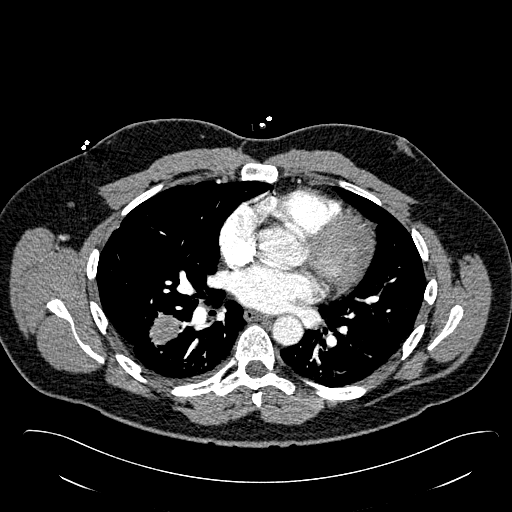
[im 125/250  lung]
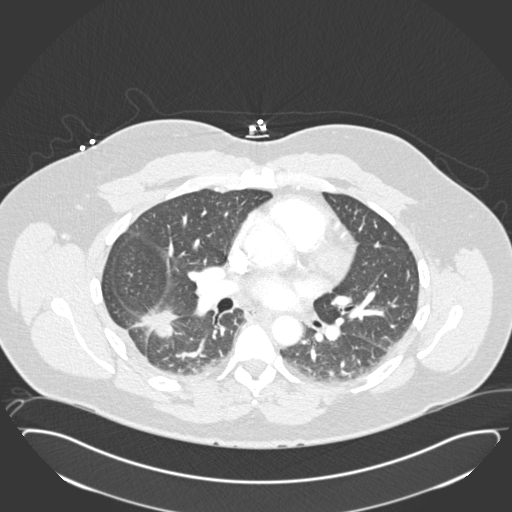
[im 139/250  mediastinal]
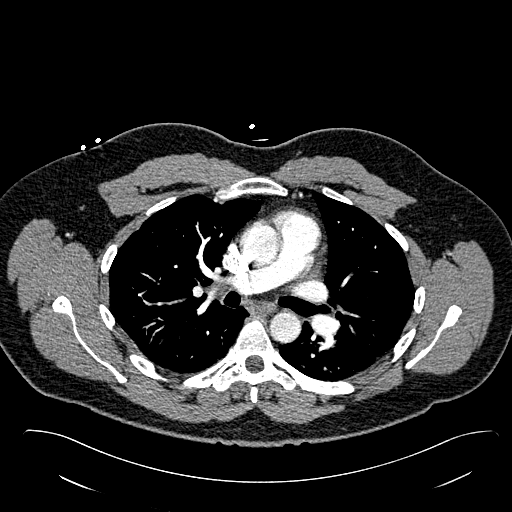
[im 153/250  lung]
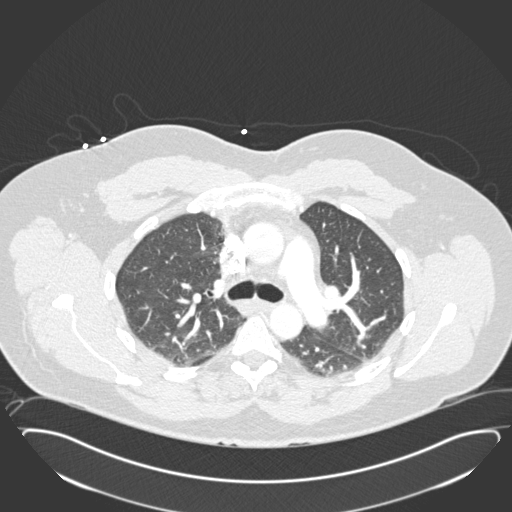
[im 167/250  mediastinal]
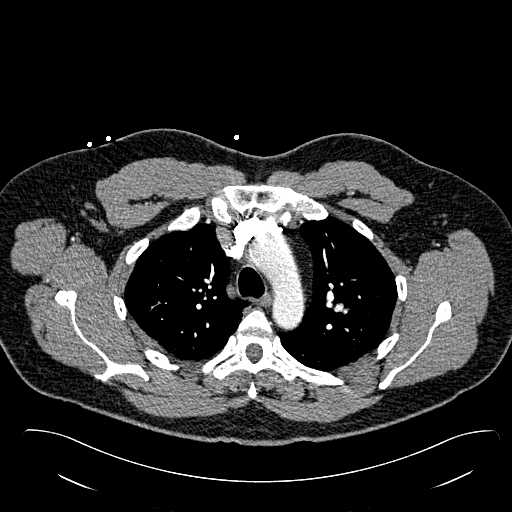
[im 180/250  lung]
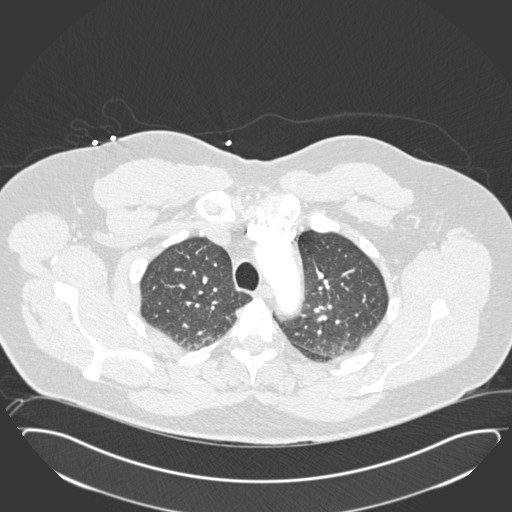
[im 194/250  mediastinal]
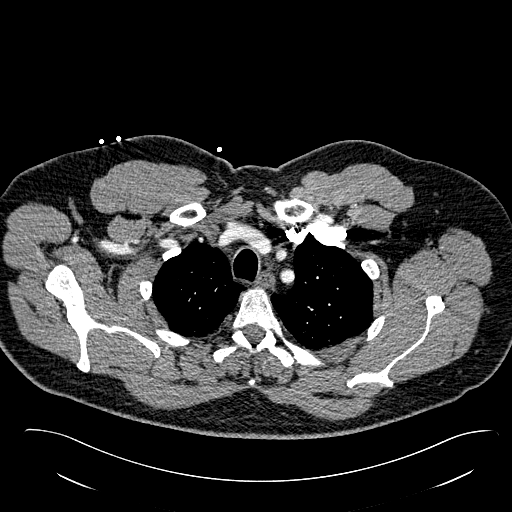
[im 208/250  lung]
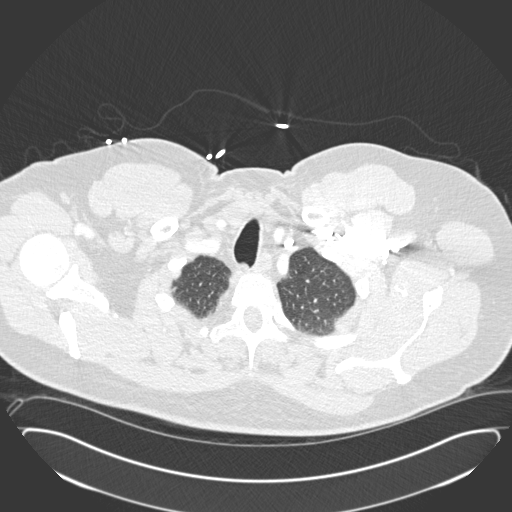
[im 222/250  mediastinal]
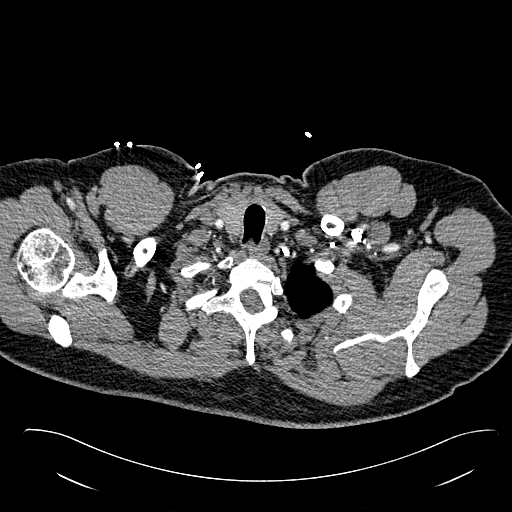
[im 236/250  lung]
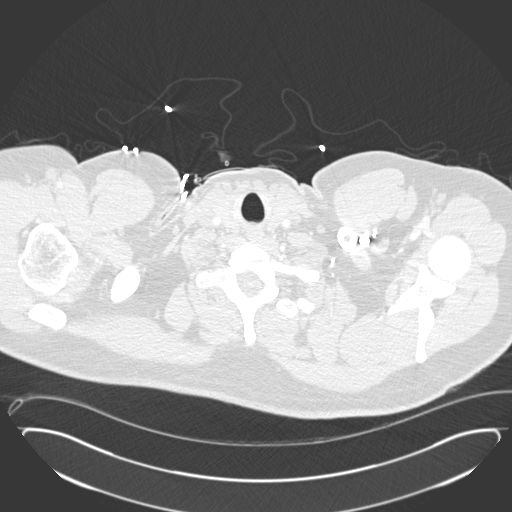

[Series 7: cor pe 2.0 mpr · coronal · 0.48mm/px · 1 of 126 slices shown]
[im 63/126  mediastinal]
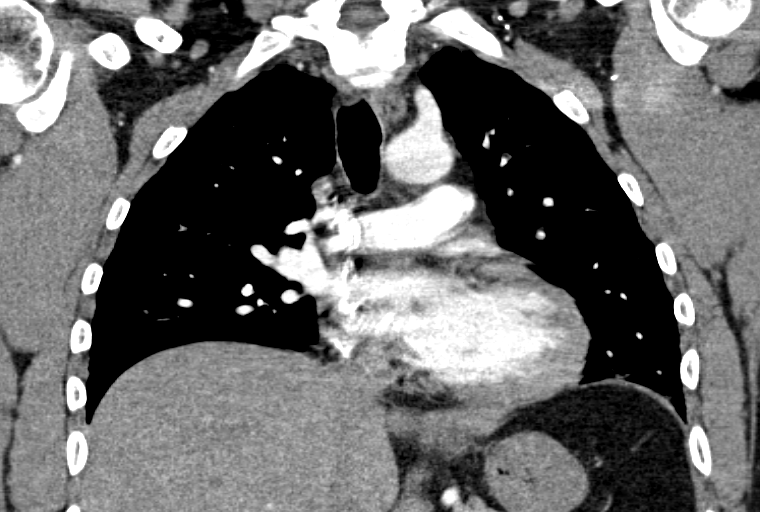

[18 of 36 positions shown; findings below may reference images not displayed]

FINDINGS: The thoracic aorta shows no dissection or dilatation. There are no
filling defects in the pulmonary arterial system. No significant
hilar or mediastinal adenopathy. Scans through the upper abdomen are
unremarkable. No acute musculoskeletal findings.

New from the prior study is a tiny right pleural effusion. Again
identified is a 25 x 28 x 31 mm mass in the in the superior segment
of the right lower lobe. This was described in detail on the prior
report. There is increased hazy interstitial opacity in the
bilateral lower lobes when compared to the prior study, primarily
dependently. This is most consistent with hypoventilatory change.

Review of the MIP images confirms the above findings.
IMPRESSION: No evidence of pulmonary arterial embolism. Known previously
described mass right lung did consistent with bronchogenic carcinoma
as described on prior CT scan as well as on [REDACTED] PET scan.

Interval development of tiny right pleural effusion as well as
dependent atelectasis.

## 2014-10-31 IMAGING — CR DG CHEST 2V
2 series · 2 of 2 positions shown · non-contrast
Comparison: PA and lateral chest x-ray at May 05, 2013 and CT
scan of the same day.

CLINICAL DATA: Right-sided chest discomfort

EXAM:
CHEST  2 VIEW

[w chest pa]
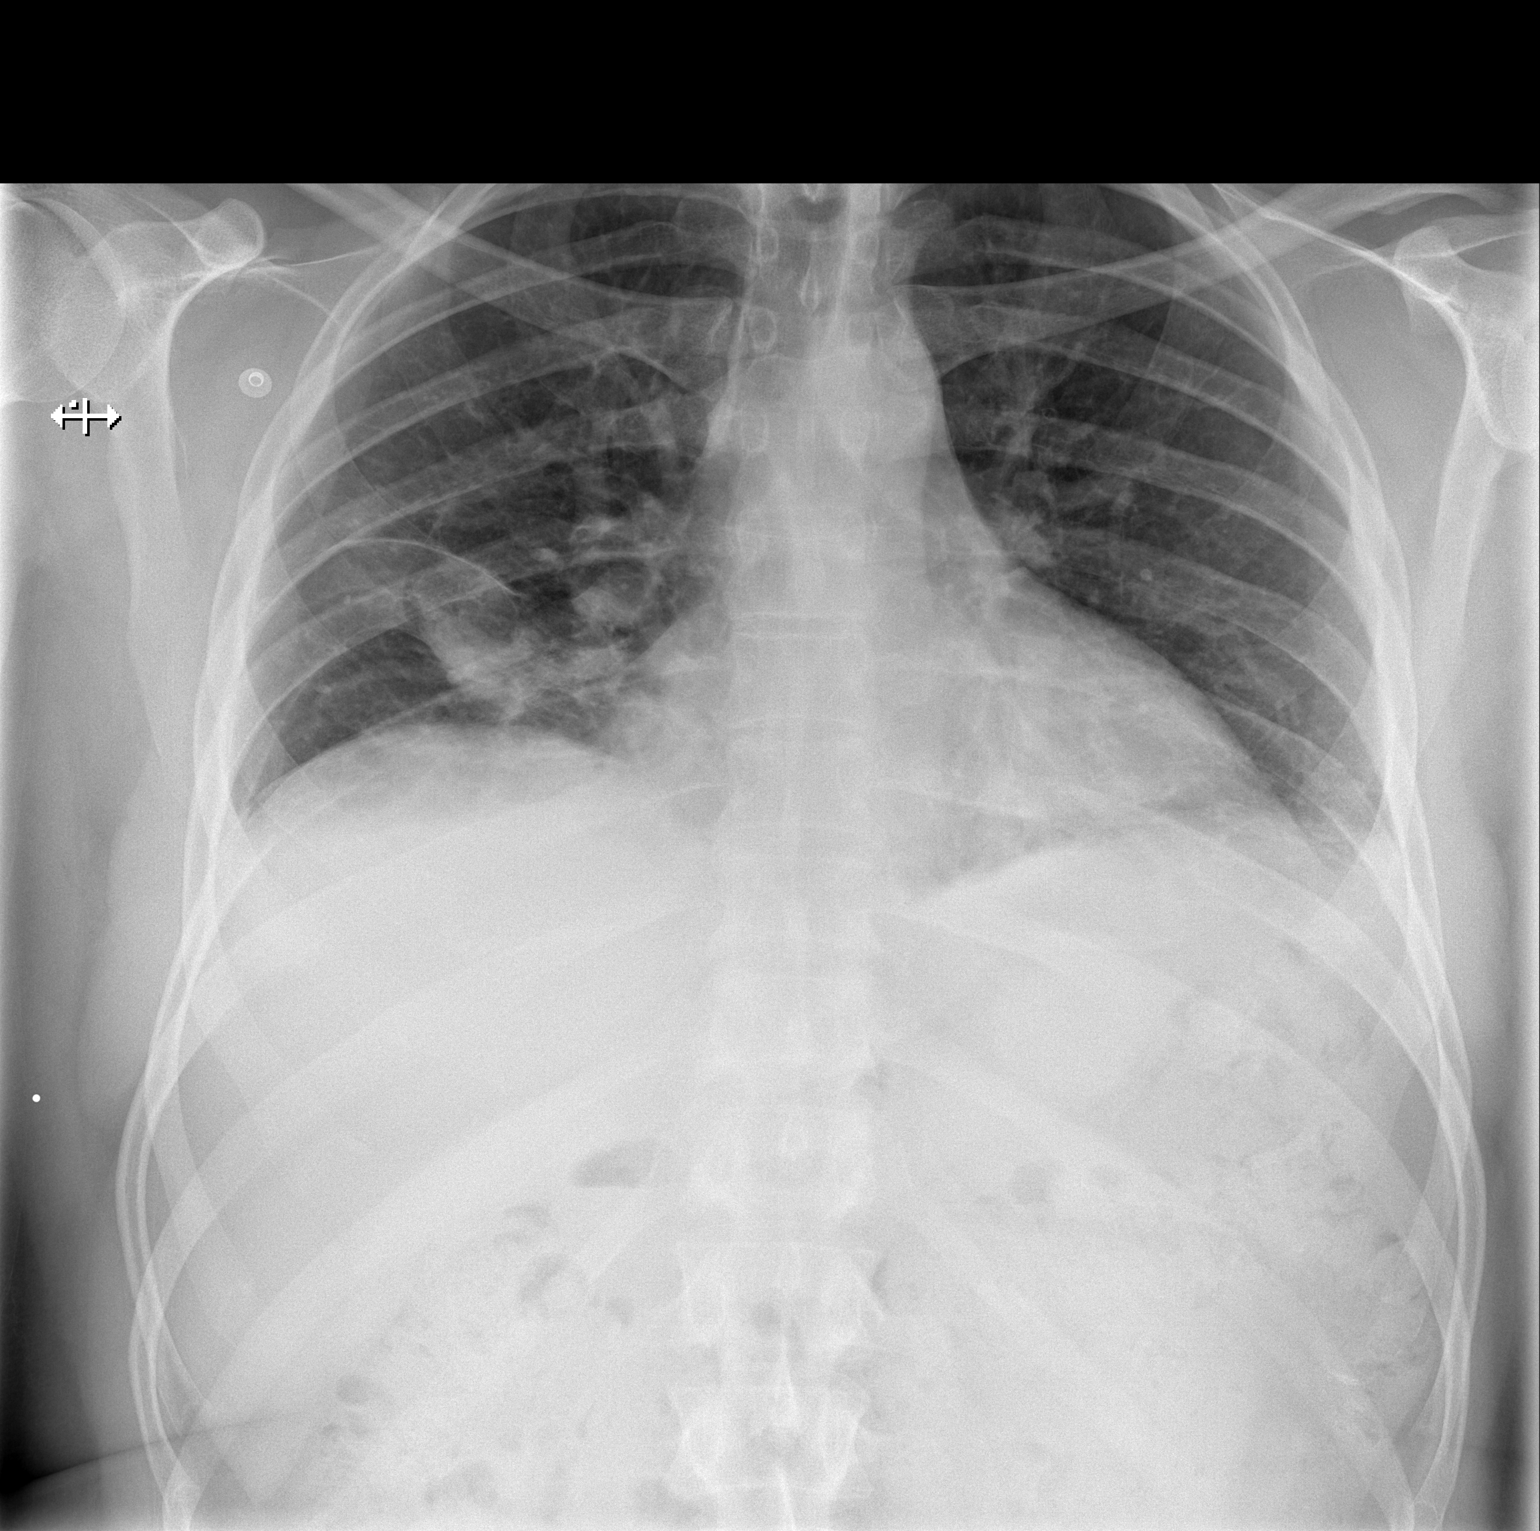

[w chest lat]
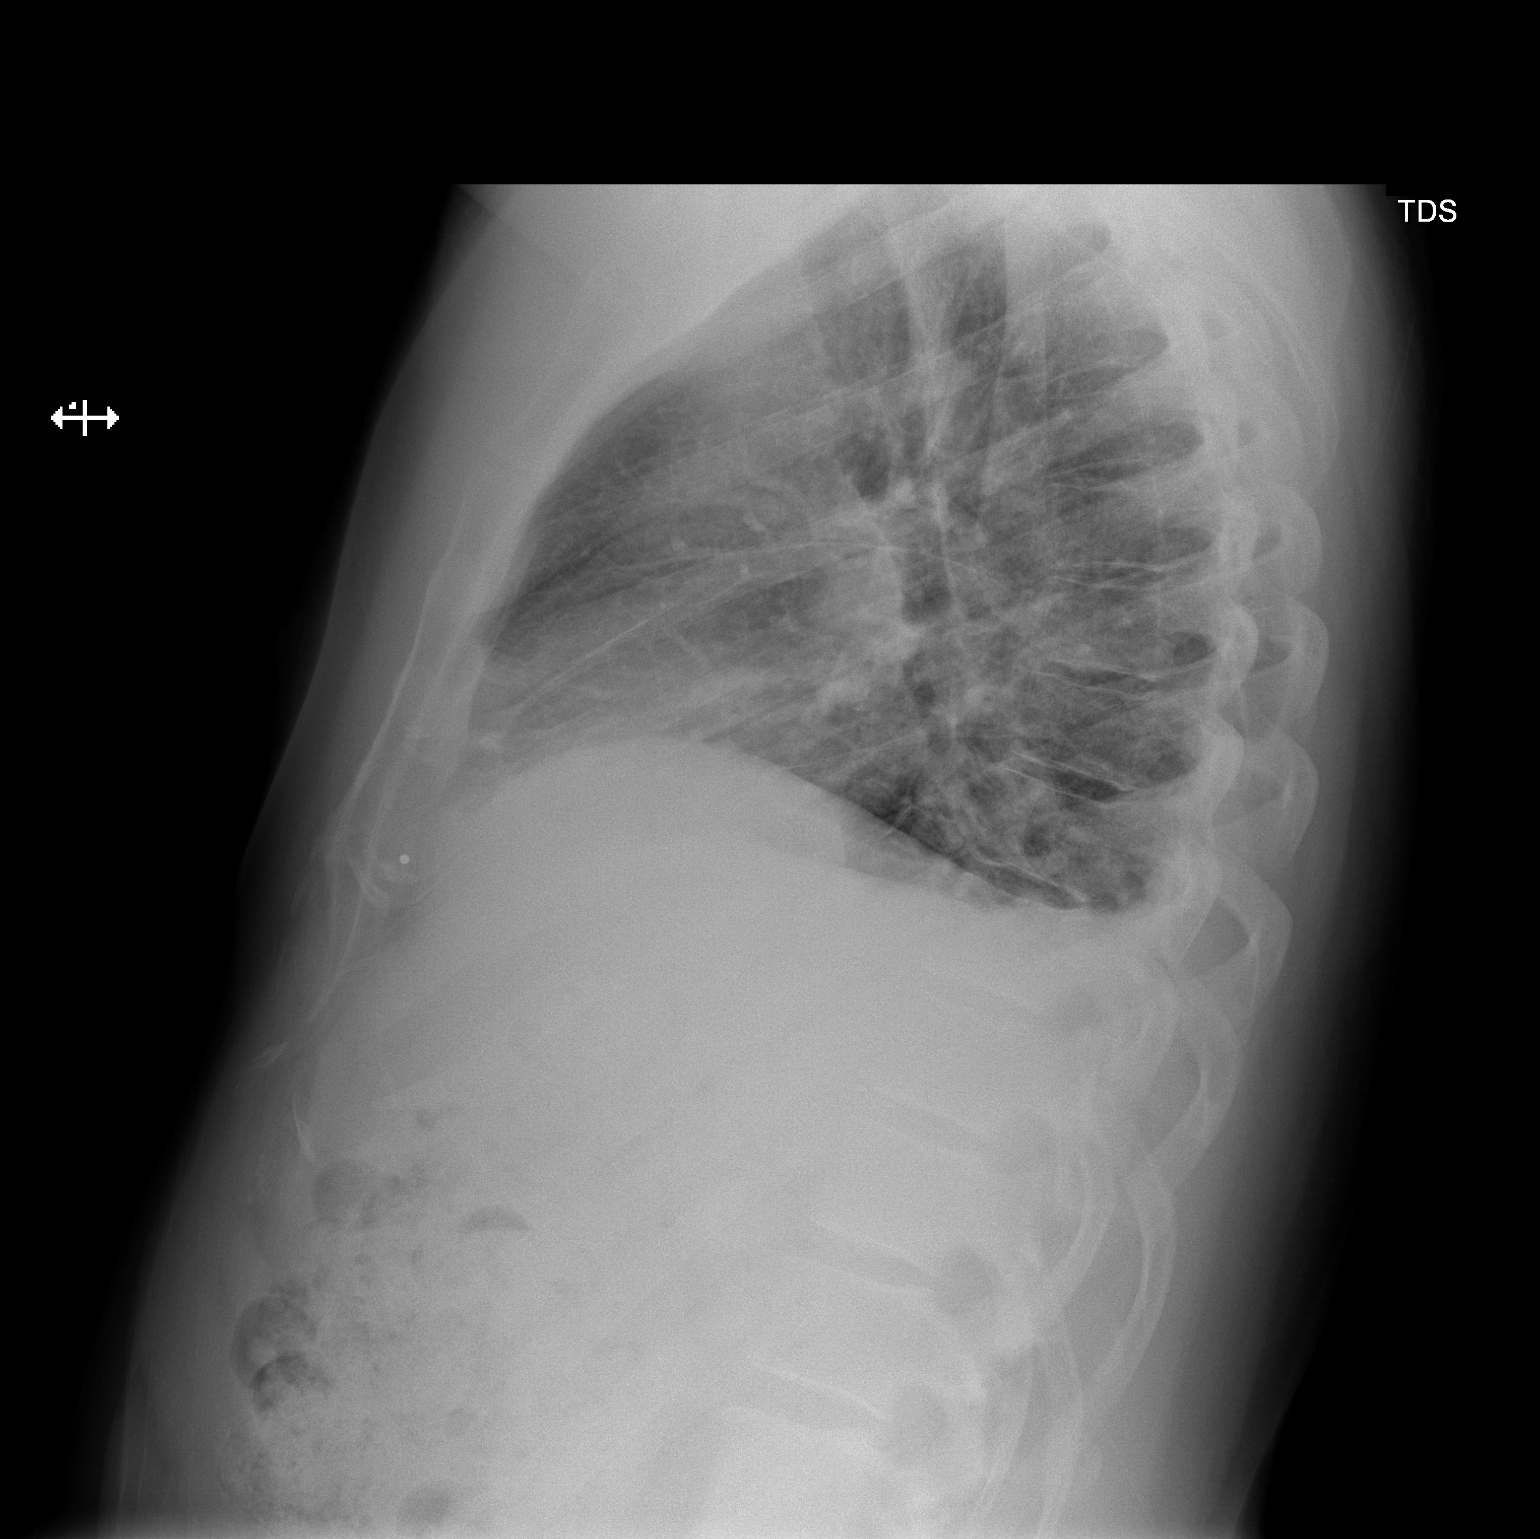

[2 of 2 positions shown; findings below may reference images not displayed]

FINDINGS: The lungs are hypoinflated today. Persistent increased density is
present adjacent to the minor fissure on the right consistent with
the known superior segment right lower lobe parenchymal mass.
Haziness of the left hemidiaphragm likely reflects new subsegmental
atelectasis. On the lateral film there is now mild blunting of both
costophrenic angles. The cardiopericardial silhouette is top-normal
in size. The pulmonary vascularity is not engorged. The mediastinum
is normal in width. There is no pleural effusion.
IMPRESSION: Since the earlier study the lungs have become more hypoinflated and
bibasilar atelectasis and small pleural effusions have developed and
increased in conspicuity. The parenchymal mass in the right lower
lobe is unchanged in appearance.

## 2014-11-01 ENCOUNTER — Other Ambulatory Visit: Payer: Self-pay | Admitting: *Deleted

## 2014-11-01 MED ORDER — VALACYCLOVIR HCL 500 MG PO TABS: 500.0000 mg | ORAL_TABLET | Freq: Every day | ORAL | Status: DC | PRN

## 2014-11-01 MED ORDER — CITALOPRAM HYDROBROMIDE 20 MG PO TABS: 20.0000 mg | ORAL_TABLET | Freq: Every day | ORAL | Status: DC

## 2014-11-01 MED ORDER — ROSUVASTATIN CALCIUM 20 MG PO TABS: 20.0000 mg | ORAL_TABLET | Freq: Every day | ORAL | Status: DC

## 2014-11-04 ENCOUNTER — Other Ambulatory Visit: Payer: Self-pay | Admitting: Internal Medicine

## 2015-01-23 ENCOUNTER — Other Ambulatory Visit: Payer: Self-pay | Admitting: Internal Medicine

## 2015-04-01 ENCOUNTER — Encounter: Payer: Self-pay | Admitting: Internal Medicine

## 2015-04-01 ENCOUNTER — Ambulatory Visit (INDEPENDENT_AMBULATORY_CARE_PROVIDER_SITE_OTHER): Payer: 59 | Admitting: Internal Medicine

## 2015-04-01 VITALS — BP 120/78 | HR 79 | Temp 98.1°F | Resp 12 | Ht 72.0 in | Wt 232.5 lb

## 2015-04-01 DIAGNOSIS — R748 Abnormal levels of other serum enzymes: Secondary | ICD-10-CM

## 2015-04-01 DIAGNOSIS — E860 Dehydration: Secondary | ICD-10-CM

## 2015-04-01 DIAGNOSIS — A09 Infectious gastroenteritis and colitis, unspecified: Secondary | ICD-10-CM

## 2015-04-01 DIAGNOSIS — R197 Diarrhea, unspecified: Secondary | ICD-10-CM | POA: Insufficient documentation

## 2015-04-01 MED ORDER — DIPHENOXYLATE-ATROPINE 2.5-0.025 MG PO TABS: 1.0000 | ORAL_TABLET | Freq: Four times a day (QID) | ORAL | Status: DC | PRN

## 2015-04-01 MED ORDER — CIPROFLOXACIN HCL 500 MG PO TABS: 500.0000 mg | ORAL_TABLET | Freq: Two times a day (BID) | ORAL | Status: DC

## 2015-04-01 NOTE — Patient Instructions (Signed)
Please return the stool samples tomorrow. You can collect them tonight and place them in your refrigerator    You can start the Lomotil after you have produced the stool for sampling/.  Take the Cipro with you to [place],  but do not start it unless I notify you to do so my e mail.

## 2015-04-01 NOTE — Progress Notes (Signed)
Subjective:  Patient ID: Robert Morse, male    DOB: 1965-08-23  Age: 49 y.o. MRN: [id number]  CC: There were no encounter diagnoses.  HPI AKIA DESROCHES presents for persistent symptoms of nausea and vomiting last Wednesday after receiving chemo (Avastin/bevacizumab)  on Monday for treatment of recurrent adenoCA of the lung , bronchoscopy  EBUS Dec 215     None since then, but has had loose stools daily for the last  five days.   Has had brief episodes in the past which resolved with 1 immodium.  Started running a fever last Wednesday 25 to 30 stools  Daily despite immodium ,  Fever resolved by Friday .  Satruday 10-12 stools daily . Some streaking of toilet paper with blood, history of emorrhoids  No abd pain =except  Mild cramp with need to defecate.  Appetite has been slowly improving   Several sick contacts at work with diarrhea  Going to [place] in the am      CBC dec 5 WBC 5.6  hgb 17.2   Last seen her Nov 2015.    Outpatient Prescriptions Prior to Visit  Medication Sig Dispense Refill  . aspirin EC 81 MG tablet Take 81 mg by mouth daily.    . citalopram (CELEXA) 20 MG tablet Take 1 tablet (20 mg total) by mouth daily. 30 tablet 5  . doxycycline (VIBRA-TABS) 100 MG tablet Take 1 tablet (100 mg total) by mouth 2 (two) times daily. 20 tablet 0  . rosuvastatin (CRESTOR) 20 MG tablet Take 1 tablet (20 mg total) by mouth daily. 30 tablet 5  . valACYclovir (VALTREX) 500 MG tablet Take 1 tablet (500 mg total) by mouth daily as needed. 90 tablet 1  . chlorpheniramine-HYDROcodone (TUSSIONEX) 10-8 MG/5ML LQCR Take 5 mLs by mouth every 12 (twelve) hours as needed. (Patient not taking: Reported on 04/01/2015) 140 mL 0  . omeprazole (PRILOSEC) 20 MG capsule TAKE ONE CAPSULE BY MOUTH EVERY DAY (Patient not taking: Reported on 04/01/2015) 30 capsule 5  . pantoprazole (PROTONIX) 40 MG tablet Take 1 tablet (40 mg total) by mouth 2 (two) times daily. (Patient not taking: Reported on 04/01/2015)  60 tablet 3   No facility-administered medications prior to visit.    Review of Systems;  Patient denies headache, fevers, malaise, unintentional weight loss, skin rash, eye pain, sinus congestion and sinus pain, sore throat, dysphagia,  hemoptysis , cough, dyspnea, wheezing, chest pain, palpitations, orthopnea, edema, abdominal pain, nausea, melena, diarrhea, constipation, flank pain, dysuria, hematuria, urinary  Frequency, nocturia, numbness, tingling, seizures,  Focal weakness, Loss of consciousness,  Tremor, insomnia, depression, anxiety, and suicidal ideation.      Objective:  BP 120/78 mmHg  Pulse 79  Temp(Src) 98.1 F (36.7 C) (Oral)  Resp 12  Ht 6' (1.829 m)  Wt 232 lb 8 oz (105.461 kg)  BMI 31.53 kg/m2  SpO2 98%  BP Readings from Last 3 Encounters:  04/01/15 120/78  02/19/14 118/72  02/08/14 122/72    Wt Readings from Last 3 Encounters:  04/01/15 232 lb 8 oz (105.461 kg)  02/19/14 241 lb 8 oz (109.544 kg)  02/08/14 240 lb 12 oz (109.203 kg)    General appearance: alert, cooperative and appears stated age Ears: normal TM's and external ear canals both ears Throat: lips, mucosa, and tongue normal; teeth and gums normal Neck: no adenopathy, no carotid bruit, supple, symmetrical, trachea midline and thyroid not enlarged, symmetric, no tenderness/mass/nodules Back: symmetric, no curvature. ROM normal. No CVA  tenderness. Lungs: clear to auscultation bilaterally Heart: regular rate and rhythm, S1, S2 normal, no murmur, click, rub or gallop Abdomen: soft, non-tender; bowel sounds normal; no masses,  no organomegaly Pulses: 2+ and symmetric Skin: Skin color, texture, turgor normal. No rashes or lesions Lymph nodes: Cervical, supraclavicular, and axillary nodes normal.  No results found for: HGBA1C  Lab Results  Component Value Date   CREATININE 0.9 07/14/2013   CREATININE 0.91 05/07/2013   CREATININE 0.87 05/05/2013    Lab Results  Component Value Date   WBC  5.7 07/14/2013   HGB 14.8 07/14/2013   HCT 43.0 07/14/2013   PLT 320.0 07/14/2013   GLUCOSE 93 07/14/2013   CHOL 198 07/14/2013   TRIG 104.0 07/14/2013   HDL 67.80 07/14/2013   LDLDIRECT 120.0 10/03/2012   LDLCALC 109* 07/14/2013   ALT 41 07/14/2013   AST 24 07/14/2013   NA 137 07/14/2013   K 4.2 07/14/2013   CL 101 07/14/2013   CREATININE 0.9 07/14/2013   BUN 17 07/14/2013   CO2 25 07/14/2013   TSH 1.21 03/31/2012   PSA 0.62 03/31/2012   INR 1.1 05/05/2013   MICROALBUR 2.0* 07/14/2013    No results found.  Assessment & Plan:   Problem List Items Addressed This Visit    None      I am having Mr. Donaghey maintain his aspirin EC, pantoprazole, doxycycline, chlorpheniramine-HYDROcodone, omeprazole, rosuvastatin, valACYclovir, citalopram, amLODipine, amLODipine, and Investigational erlotinib.  Meds ordered this encounter  Medications  . amLODipine (NORVASC) 5 MG tablet    Sig: Take 1 tablet by mouth daily.    Refill:  4  . amLODipine (NORVASC) 2.5 MG tablet    Sig: TAKE ONE TABLET BY MOUTH ONCE DAILY. TO ADD WITH 5 MG FOR TOTAL DOSE OF 7.5 MG DAILY    Refill:  3  . Investigational erlotinib (TARCEVA) 150 mg tablet [id number]    Sig: Take by mouth.    There are no discontinued medications.  Follow-up: No Follow-up on file.   Crecencio Mc, MD

## 2015-04-01 NOTE — Progress Notes (Signed)
Pre-visit discussion using our clinic review tool. No additional management support is needed unless otherwise documented below in the visit note.  

## 2015-04-02 ENCOUNTER — Other Ambulatory Visit: Payer: Self-pay | Admitting: Internal Medicine

## 2015-04-02 DIAGNOSIS — R748 Abnormal levels of other serum enzymes: Secondary | ICD-10-CM | POA: Insufficient documentation

## 2015-04-02 DIAGNOSIS — E860 Dehydration: Secondary | ICD-10-CM | POA: Insufficient documentation

## 2015-04-02 LAB — CBC WITH DIFFERENTIAL/PLATELET
BASOS PCT: 0.5 % (ref 0.0–3.0)
Basophils Absolute: 0 10*3/uL (ref 0.0–0.1)
EOS PCT: 9.1 % — AB (ref 0.0–5.0)
Eosinophils Absolute: 0.5 10*3/uL (ref 0.0–0.7)
HCT: 45.3 % (ref 39.0–52.0)
Hemoglobin: 15.4 g/dL (ref 13.0–17.0)
LYMPHS ABS: 1 10*3/uL (ref 0.7–4.0)
Lymphocytes Relative: 18.5 % (ref 12.0–46.0)
MCHC: 34 g/dL (ref 30.0–36.0)
MCV: 89.6 fl (ref 78.0–100.0)
MONO ABS: 0.9 10*3/uL (ref 0.1–1.0)
Monocytes Relative: 16.5 % — ABNORMAL HIGH (ref 3.0–12.0)
NEUTROS ABS: 3.1 10*3/uL (ref 1.4–7.7)
NEUTROS PCT: 55.4 % (ref 43.0–77.0)
Platelets: 297 10*3/uL (ref 150.0–400.0)
RBC: 5.06 Mil/uL (ref 4.22–5.81)
RDW: 12.8 % (ref 11.5–15.5)
WBC: 5.6 10*3/uL (ref 4.0–10.5)

## 2015-04-02 LAB — COMPREHENSIVE METABOLIC PANEL
ALBUMIN: 4.1 g/dL (ref 3.5–5.2)
ALT: 72 U/L — ABNORMAL HIGH (ref 0–53)
AST: 47 U/L — AB (ref 0–37)
Alkaline Phosphatase: 88 U/L (ref 39–117)
BUN: 13 mg/dL (ref 6–23)
CHLORIDE: 93 meq/L — AB (ref 96–112)
CO2: 32 meq/L (ref 19–32)
CREATININE: 1.19 mg/dL (ref 0.40–1.50)
Calcium: 9.2 mg/dL (ref 8.4–10.5)
GFR: 68.82 mL/min (ref >60.00)
GLUCOSE: 95 mg/dL (ref 70–99)
Potassium: 4.1 mEq/L (ref 3.5–5.1)
SODIUM: 134 meq/L — AB (ref 135–145)
Total Bilirubin: 0.8 mg/dL (ref 0.2–1.2)
Total Protein: 6.7 g/dL (ref 6.0–8.3)

## 2015-04-02 LAB — MAGNESIUM: MAGNESIUM: 2.1 mg/dL (ref 1.5–2.5)

## 2015-04-02 NOTE — Assessment & Plan Note (Signed)
Lomotil given.  Stool cultures ordered..  Will add abx if indicated.

## 2015-04-02 NOTE — Assessment & Plan Note (Addendum)
Secondary to GI losses from diarrhea.  Encouraged to increase intake of water and lytes.

## 2015-04-02 NOTE — Assessment & Plan Note (Signed)
MILD, in the setting of persistent diarrhea and dehydration.  Will repeat in one week.

## 2015-04-03 ENCOUNTER — Encounter: Payer: Self-pay | Admitting: Internal Medicine

## 2015-04-03 LAB — OVA AND PARASITE EXAMINATION: OP: NONE SEEN

## 2015-04-03 LAB — C. DIFFICILE GDH AND TOXIN A/B
C. DIFF TOXIN A/B: NOT DETECTED
C. difficile GDH: NOT DETECTED

## 2015-04-06 LAB — STOOL CULTURE

## 2015-04-30 ENCOUNTER — Other Ambulatory Visit: Payer: Self-pay | Admitting: Internal Medicine

## 2015-05-14 ENCOUNTER — Other Ambulatory Visit: Payer: Self-pay | Admitting: Internal Medicine

## 2015-05-14 ENCOUNTER — Encounter: Payer: Self-pay | Admitting: *Deleted

## 2015-05-14 NOTE — Telephone Encounter (Signed)
He has been followed at [facility]. Has he had any recent labs.

## 2015-05-14 NOTE — Telephone Encounter (Signed)
Ok to fill last lipid 3/15?

## 2015-05-17 ENCOUNTER — Ambulatory Visit: Payer: 59 | Admitting: Internal Medicine

## 2015-05-30 ENCOUNTER — Other Ambulatory Visit: Payer: Self-pay | Admitting: Internal Medicine

## 2015-05-31 ENCOUNTER — Ambulatory Visit (INDEPENDENT_AMBULATORY_CARE_PROVIDER_SITE_OTHER): Payer: 59 | Admitting: Internal Medicine

## 2015-05-31 ENCOUNTER — Encounter: Payer: Self-pay | Admitting: Internal Medicine

## 2015-05-31 VITALS — BP 125/84 | HR 82 | Temp 98.0°F | Ht 72.0 in | Wt 234.0 lb

## 2015-05-31 DIAGNOSIS — C3491 Malignant neoplasm of unspecified part of right bronchus or lung: Secondary | ICD-10-CM

## 2015-05-31 DIAGNOSIS — E785 Hyperlipidemia, unspecified: Secondary | ICD-10-CM

## 2015-05-31 DIAGNOSIS — I10 Essential (primary) hypertension: Secondary | ICD-10-CM

## 2015-05-31 DIAGNOSIS — R748 Abnormal levels of other serum enzymes: Secondary | ICD-10-CM | POA: Diagnosis not present

## 2015-05-31 MED ORDER — VALACYCLOVIR HCL 500 MG PO TABS: 500.0000 mg | ORAL_TABLET | Freq: Every day | ORAL | Status: AC | PRN

## 2015-05-31 MED ORDER — AMLODIPINE BESYLATE 10 MG PO TABS
10.0000 mg | ORAL_TABLET | Freq: Every day | ORAL | Status: DC
Start: 2015-05-31 — End: 2016-05-21

## 2015-05-31 MED ORDER — ROSUVASTATIN CALCIUM 20 MG PO TABS: 20.0000 mg | ORAL_TABLET | Freq: Every day | ORAL | Status: DC

## 2015-05-31 NOTE — Progress Notes (Signed)
Pre visit review using our clinic review tool, if applicable. No additional management support is needed unless otherwise documented below in the visit note. 

## 2015-05-31 NOTE — Patient Instructions (Signed)
Fasting labs on Tuesday.  Follow up 6 months.

## 2015-05-31 NOTE — Assessment & Plan Note (Signed)
Reviewed notes from [place]. Continues on chemotherapy with Erlotnib. Will continue to follow.

## 2015-05-31 NOTE — Assessment & Plan Note (Signed)
BP Readings from Last 3 Encounters:  05/31/15 125/84  04/01/15 120/78  02/19/14 118/72   BP recently elevated, possibly related to chemotherapy. Today, BP is well controlled on Amlodipine '10mg'$  daily. Will plan to continue Amlodipine. He will email with any questions or concerns about BP.

## 2015-05-31 NOTE — Assessment & Plan Note (Signed)
Repeat fasting lipids with labs next week. Continue Rosuvastatin.

## 2015-05-31 NOTE — Assessment & Plan Note (Signed)
Mild elevation of LFTs noted on labs from Robert Morse. Will repeat lipids with labs next week.

## 2015-05-31 NOTE — Progress Notes (Signed)
Subjective:    Patient ID: Robert Morse, male    DOB: [birth year], 50 y.o.   MRN: [id number]  HPI  50YO male presents for follow up. Last seen in 02/2014.  Adenocarcinoma Lung - s/p right VATS in 04/2013, then recurrent disease noted in 03/2014, Tarceva and Avastin at [facility]. Continues to follow with Dr. Sharlet Salina.  Hospitalized in 03/2015 for Influenza. Feels that energy level is gradually improving. Continues to have some clear rhinorrhea and hoarse voice.  Recently having some elevated blood pressure. Started on Amlodipine. Currently taking '10mg'$  of Amlodipine. No side effects noted from medication.    Wt Readings from Last 3 Encounters:  05/31/15 234 lb (106.142 kg)  04/01/15 232 lb 8 oz (105.461 kg)  02/19/14 241 lb 8 oz (109.544 kg)   BP Readings from Last 3 Encounters:  05/31/15 125/84  04/01/15 120/78  02/19/14 118/72    Past Medical History  Diagnosis Date  . Hyperlipidemia     takes Crestor daily  . Polyp of colon     Dr. Candace Cruise at [place]  . Thyroid nodule     small polyp on thyroid  . GERD (gastroesophageal reflux disease)     takes Omeprazole daily  . Depression     takes Celexa daily  . Atherosclerosis   . Sinus infection     takes Augmentin daily  . Shortness of breath     with exertion  . History of bronchitis     Aug 2014  . History of gout   . Sleep apnea     uses a cpap;sleep study done about a yr and a half ago   Family History  Problem Relation Age of Onset  . Heart disease Father     s/p CABG  . Hypertension Father   . Diabetes Father   . Hyperlipidemia Father   . Heart attack Father   . Hyperlipidemia Maternal Grandmother   . Hypertension Maternal Grandmother   . Hyperlipidemia Maternal Grandfather   . Heart disease Maternal Grandfather   . Hypertension Maternal Grandfather   . Cancer Other     cancer  . Diabetes Other   . Heart disease Maternal Uncle   . Heart disease Paternal Uncle   . Heart disease Paternal Grandfather   .  Healthy Mother    Past Surgical History  Procedure Laterality Date  . Nasal septum surgery  7-66yr ago    Dr. CCarlis Abbott . Meniscus repair  2012    [facility] . Tonsillectomy and adenoidectomy  1973    adenoids as a child  . Colonoscopy    . Esophagogastroduodenoscopy    . Cardiac catheterization  11/2007    [facility]: Normal coronary arteries.  . Lung cancer surgery  1.28.15   Social History   Social History  . Marital Status: Married    Spouse Name: N/A  . Number of Children: 2  . Years of Education: N/A   Occupational History  . sales    Social History Main Topics  . Smoking status: Never Smoker   . Smokeless tobacco: None  . Alcohol Use: Yes     Comment: nighlty  . Drug Use: No  . Sexual Activity: Yes   Other Topics Concern  . None   Social History Narrative   [place], [place] 16[place]. Dogs in home.      Work - [employer], engineering  Diet - healthy  Exercise - 75mn cardio per day, trainer, golf    Review of Systems  Constitutional: Positive for fatigue. Negative for fever, chills, activity change, appetite change and unexpected weight change.  HENT: Positive for congestion, postnasal drip, rhinorrhea and voice change. Negative for sinus pressure, sore throat, tinnitus and trouble swallowing.   Eyes: Negative for visual disturbance.  Respiratory: Negative for cough and shortness of breath.   Cardiovascular: Positive for chest pain (occasional right chest wall pain, pleuritic, chronic). Negative for palpitations and leg swelling.  Gastrointestinal: Negative for nausea, vomiting, abdominal pain, diarrhea, constipation and abdominal distention.  Genitourinary: Negative for dysuria, urgency and difficulty urinating.  Musculoskeletal: Negative for myalgias, arthralgias and gait problem.  Skin: Negative for color change and rash.  Hematological: Negative for adenopathy.  Psychiatric/Behavioral: Negative for  suicidal ideas, sleep disturbance and dysphoric mood. The patient is not nervous/anxious.        Objective:    BP 125/84 mmHg  Pulse 82  Temp(Src) 98 F (36.7 C) (Oral)  Ht 6' (1.829 m)  Wt 234 lb (106.142 kg)  BMI 31.73 kg/m2  SpO2 99% Physical Exam  Constitutional: He is oriented to person, place, and time. He appears well-developed and well-nourished. No distress.  HENT:  Head: Normocephalic and atraumatic.  Right Ear: External ear normal.  Left Ear: External ear normal.  Nose: Rhinorrhea present.  Mouth/Throat: Oropharynx is clear and moist. No oropharyngeal exudate.  Eyes: Conjunctivae and EOM are normal. Pupils are equal, round, and reactive to light. Right eye exhibits no discharge. Left eye exhibits no discharge. No scleral icterus.  Neck: Normal range of motion. Neck supple. No tracheal deviation present. No thyromegaly present.  Cardiovascular: Normal rate, regular rhythm and normal heart sounds.  Exam reveals no gallop and no friction rub.   No murmur heard. Pulmonary/Chest: Effort normal and breath sounds normal. No accessory muscle usage. No tachypnea. No respiratory distress. He has no decreased breath sounds. He has no wheezes. He has no rhonchi. He has no rales. He exhibits no tenderness.  Musculoskeletal: Normal range of motion. He exhibits no edema.  Lymphadenopathy:    He has no cervical adenopathy.  Neurological: He is alert and oriented to person, place, and time. No cranial nerve deficit. Coordination normal.  Skin: Skin is warm and dry. No rash noted. He is not diaphoretic. No erythema. No pallor.  Psychiatric: He has a normal mood and affect. His behavior is normal. Judgment and thought content normal.          Assessment & Plan:   Problem List Items Addressed This Visit      Unprioritized   Elevated liver enzymes    [facility] Will repeat lipids with labs next week.      Essential hypertension    BP  Readings from Last 3 Encounters:  05/31/15 125/84  04/01/15 120/78  02/19/14 118/72   BP recently elevated, possibly related to chemotherapy. Today, BP is well controlled on Amlodipine '10mg'$  daily. Will plan to continue Amlodipine. He will email with any questions or concerns about BP.      Relevant Medications   rosuvastatin (CRESTOR) 20 MG tablet   amLODipine (NORVASC) 10 MG tablet   Hyperlipidemia - Primary    Repeat fasting lipids with labs next week. Continue Rosuvastatin.      Relevant Medications   rosuvastatin (CRESTOR) 20 MG tablet   amLODipine (NORVASC) 10 MG tablet   Other Relevant Orders   Comprehensive metabolic panel  Lipid panel   Malignant neoplasm of unspecified part of right bronchus or lung [facility])    Reviewed notes from [place]. Continues on chemotherapy with Erlotnib. Will continue to follow.       Relevant Orders   MRSA culture       Return in about 6 months (around 11/28/2015) for Recheck.

## 2015-06-02 LAB — MRSA CULTURE

## 2015-06-04 ENCOUNTER — Other Ambulatory Visit (INDEPENDENT_AMBULATORY_CARE_PROVIDER_SITE_OTHER): Payer: 59

## 2015-06-04 ENCOUNTER — Encounter: Payer: Self-pay | Admitting: Internal Medicine

## 2015-06-04 DIAGNOSIS — R748 Abnormal levels of other serum enzymes: Secondary | ICD-10-CM

## 2015-06-04 DIAGNOSIS — E785 Hyperlipidemia, unspecified: Secondary | ICD-10-CM

## 2015-06-04 LAB — COMPREHENSIVE METABOLIC PANEL
ALBUMIN: 4.7 g/dL (ref 3.5–5.2)
ALK PHOS: 58 U/L (ref 39–117)
ALT: 61 U/L — AB (ref 0–53)
AST: 38 U/L — ABNORMAL HIGH (ref 0–37)
BILIRUBIN TOTAL: 1.4 mg/dL — AB (ref 0.2–1.2)
BUN: 14 mg/dL (ref 6–23)
CO2: 31 mEq/L (ref 19–32)
Calcium: 9.8 mg/dL (ref 8.4–10.5)
Chloride: 98 mEq/L (ref 96–112)
Creatinine, Ser: 1.03 mg/dL (ref 0.40–1.50)
GFR: 81.25 mL/min (ref >60.00)
GLUCOSE: 98 mg/dL (ref 70–99)
Potassium: 4.3 mEq/L (ref 3.5–5.1)
SODIUM: 137 meq/L (ref 135–145)
TOTAL PROTEIN: 7.6 g/dL (ref 6.0–8.3)

## 2015-06-04 LAB — HEPATIC FUNCTION PANEL
ALBUMIN: 4.7 g/dL (ref 3.5–5.2)
ALK PHOS: 58 U/L (ref 39–117)
ALT: 61 U/L — ABNORMAL HIGH (ref 0–53)
AST: 38 U/L — ABNORMAL HIGH (ref 0–37)
BILIRUBIN TOTAL: 1.4 mg/dL — AB (ref 0.2–1.2)
Bilirubin, Direct: 0.2 mg/dL (ref 0.0–0.3)
Total Protein: 7.6 g/dL (ref 6.0–8.3)

## 2015-06-04 LAB — LIPID PANEL
Cholesterol: 235 mg/dL — ABNORMAL HIGH (ref 0–200)
HDL: 89.3 mg/dL (ref >39.00)
LDL Cholesterol: 121 mg/dL — ABNORMAL HIGH (ref 0–99)
NONHDL: 145.7
Total CHOL/HDL Ratio: 3
Triglycerides: 126 mg/dL (ref 0.0–149.0)
VLDL: 25.2 mg/dL (ref 0.0–40.0)

## 2015-06-14 ENCOUNTER — Other Ambulatory Visit: Payer: 59

## 2015-06-24 ENCOUNTER — Other Ambulatory Visit: Payer: Self-pay | Admitting: Internal Medicine

## 2015-09-18 ENCOUNTER — Ambulatory Visit: Payer: 59 | Admitting: Internal Medicine

## 2015-11-28 ENCOUNTER — Ambulatory Visit: Payer: 59 | Admitting: Internal Medicine

## 2015-12-30 ENCOUNTER — Ambulatory Visit (INDEPENDENT_AMBULATORY_CARE_PROVIDER_SITE_OTHER): Payer: 59 | Admitting: Family Medicine

## 2015-12-30 ENCOUNTER — Encounter: Payer: Self-pay | Admitting: Family Medicine

## 2015-12-30 DIAGNOSIS — I10 Essential (primary) hypertension: Secondary | ICD-10-CM | POA: Diagnosis not present

## 2015-12-30 DIAGNOSIS — C3491 Malignant neoplasm of unspecified part of right bronchus or lung: Secondary | ICD-10-CM | POA: Diagnosis not present

## 2015-12-30 DIAGNOSIS — F419 Anxiety disorder, unspecified: Secondary | ICD-10-CM | POA: Diagnosis not present

## 2015-12-30 MED ORDER — CITALOPRAM HYDROBROMIDE 20 MG PO TABS: ORAL_TABLET | ORAL | 5 refills | Status: DC

## 2015-12-30 NOTE — Progress Notes (Signed)
  Tommi Rumps, MD Phone: [phone]  Robert Morse is a 50 y.o. male who presents today for follow-up.   Anxiety: Patient notes he has been on Celexa for greater than 10 years for anxiety. Notes in the past he had issues with being nervous and feeling high strung. No anxiety at this time. No depression. He wants to continue the medication for now.  Stage IV adenocarcinoma of the lung: Patient has been dealing with this for several years. He has had right lower and middle lobes removed in the past. Notes he is currently on a clinical trial through [facility] as he had spreading of his cancer into his pelvis. Notes currently the only noted area is still in his lung. Notes it has regressed significantly since being on the clinical trial. He follows with the doctors at [facility] every 3 weeks. Additionally notes he takes doxycycline for the rash that is caused by the medication he is on for treatment of this issue.  HYPERTENSION  Disease Monitoring  Home BP Monitoring notes weekly in the 130s over 80s Chest pain- no    Dyspnea- no Medications  Compliance-  taking amlodipine.  Edema- had some initially after starting the medication though none recently.   PMH: nonsmoker.   ROS see history of present illness  Objective  Physical Exam Vitals:   12/30/15 1436  BP: 124/78  Pulse: 77  Temp: 98.2 F (36.8 C)    BP Readings from Last 3 Encounters:  12/30/15 124/78  05/31/15 125/84  04/01/15 120/78   Wt Readings from Last 3 Encounters:  12/30/15 236 lb 6.4 oz (107.2 kg)  05/31/15 234 lb (106.1 kg)  04/01/15 232 lb 8 oz (105.5 kg)    Physical Exam  Constitutional: No distress.  Cardiovascular: Normal rate, regular rhythm and normal heart sounds.   Pulmonary/Chest: Effort normal and breath sounds normal.  Neurological: He is alert. Gait normal.  Skin: Skin is warm and dry. Rash (scattered erythematous papular rash on arms and scalp) noted. He is not diaphoretic.      Assessment/Plan: Please see individual problem list.  Essential hypertension At goal. Continue amlodipine.  Malignant neoplasm of unspecified part of right bronchus or lung ([place]) He'll continue to follow with oncology through [facility]. He is currently on erlotinib. We will continue to follow along with them.  Anxiety Controlled. Continue Celexa.   No orders of the defined types were placed in this encounter.   Meds ordered this encounter  Medications  . citalopram (CELEXA) 20 MG tablet    Sig: TAKE 1 TABLET (20 MG TOTAL) BY MOUTH DAILY.    Dispense:  30 tablet    Refill:  [place], MD [facility]

## 2015-12-30 NOTE — Progress Notes (Signed)
Pre visit review using our clinic review tool, if applicable. No additional management support is needed unless otherwise documented below in the visit note. 

## 2015-12-30 NOTE — Assessment & Plan Note (Signed)
He'll continue to follow with oncology through [facility]. He is currently on erlotinib. We will continue to follow along with them.

## 2015-12-30 NOTE — Assessment & Plan Note (Signed)
Controlled. Continue Celexa.

## 2015-12-30 NOTE — Patient Instructions (Signed)
Nice to meet you. I refilled your Celexa. If you are ever ready to come off of this please let us know and we can taper off. Please continue to follow with the oncologist. Please continue your blood pressure and cholesterol medications.

## 2015-12-30 NOTE — Assessment & Plan Note (Signed)
At goal. Continue amlodipine.  

## 2016-05-21 ENCOUNTER — Other Ambulatory Visit: Payer: Self-pay | Admitting: Family Medicine

## 2016-05-21 MED ORDER — AMLODIPINE BESYLATE 10 MG PO TABS: 10.0000 mg | ORAL_TABLET | Freq: Every day | ORAL | 3 refills | Status: AC

## 2016-05-21 NOTE — Telephone Encounter (Signed)
Last filled by Dr.Walker 05/31/15 90 3rf last OV 12/30/15

## 2016-05-21 NOTE — Telephone Encounter (Signed)
amLODipine (NORVASC) 10 MG tablet take one tablet every day qty 90 refills 3

## 2016-06-17 ENCOUNTER — Inpatient Hospital Stay
Admission: EM | Admit: 2016-06-17 | Discharge: 2016-06-19 | DRG: 177 | Disposition: A | Discharge status: Home / Self Care | Payer: Commercial Managed Care - HMO | Attending: Internal Medicine | Admitting: Internal Medicine

## 2016-06-17 ENCOUNTER — Emergency Department: Payer: Commercial Managed Care - HMO

## 2016-06-17 DIAGNOSIS — G4730 Sleep apnea, unspecified: Secondary | ICD-10-CM | POA: Diagnosis present

## 2016-06-17 DIAGNOSIS — Z8249 Family history of ischemic heart disease and other diseases of the circulatory system: Secondary | ICD-10-CM

## 2016-06-17 DIAGNOSIS — C3490 Malignant neoplasm of unspecified part of unspecified bronchus or lung: Secondary | ICD-10-CM | POA: Diagnosis present

## 2016-06-17 DIAGNOSIS — Z86711 Personal history of pulmonary embolism: Secondary | ICD-10-CM

## 2016-06-17 DIAGNOSIS — Z86010 Personal history of colonic polyps: Secondary | ICD-10-CM

## 2016-06-17 DIAGNOSIS — E785 Hyperlipidemia, unspecified: Secondary | ICD-10-CM | POA: Diagnosis present

## 2016-06-17 DIAGNOSIS — J9621 Acute and chronic respiratory failure with hypoxia: Secondary | ICD-10-CM | POA: Diagnosis present

## 2016-06-17 DIAGNOSIS — J189 Pneumonia, unspecified organism: Secondary | ICD-10-CM | POA: Diagnosis present

## 2016-06-17 DIAGNOSIS — R0602 Shortness of breath: Secondary | ICD-10-CM | POA: Diagnosis not present

## 2016-06-17 DIAGNOSIS — E860 Dehydration: Secondary | ICD-10-CM | POA: Diagnosis present

## 2016-06-17 DIAGNOSIS — J690 Pneumonitis due to inhalation of food and vomit: Secondary | ICD-10-CM

## 2016-06-17 DIAGNOSIS — E871 Hypo-osmolality and hyponatremia: Secondary | ICD-10-CM | POA: Diagnosis present

## 2016-06-17 DIAGNOSIS — Z7952 Long term (current) use of systemic steroids: Secondary | ICD-10-CM

## 2016-06-17 DIAGNOSIS — F329 Major depressive disorder, single episode, unspecified: Secondary | ICD-10-CM | POA: Diagnosis present

## 2016-06-17 DIAGNOSIS — K219 Gastro-esophageal reflux disease without esophagitis: Secondary | ICD-10-CM | POA: Diagnosis present

## 2016-06-17 DIAGNOSIS — D72829 Elevated white blood cell count, unspecified: Secondary | ICD-10-CM | POA: Diagnosis present

## 2016-06-17 DIAGNOSIS — R0600 Dyspnea, unspecified: Secondary | ICD-10-CM

## 2016-06-17 DIAGNOSIS — Z885 Allergy status to narcotic agent status: Secondary | ICD-10-CM

## 2016-06-17 DIAGNOSIS — Z79899 Other long term (current) drug therapy: Secondary | ICD-10-CM

## 2016-06-17 DIAGNOSIS — I10 Essential (primary) hypertension: Secondary | ICD-10-CM | POA: Diagnosis present

## 2016-06-17 DIAGNOSIS — Z9981 Dependence on supplemental oxygen: Secondary | ICD-10-CM

## 2016-06-17 DIAGNOSIS — Z833 Family history of diabetes mellitus: Secondary | ICD-10-CM

## 2016-06-17 DIAGNOSIS — T380X5A Adverse effect of glucocorticoids and synthetic analogues, initial encounter: Secondary | ICD-10-CM | POA: Diagnosis present

## 2016-06-17 DIAGNOSIS — Z7901 Long term (current) use of anticoagulants: Secondary | ICD-10-CM

## 2016-06-17 DIAGNOSIS — Z79891 Long term (current) use of opiate analgesic: Secondary | ICD-10-CM

## 2016-06-17 HISTORY — DX: Malignant neoplasm of unspecified part of unspecified bronchus or lung: C34.90

## 2016-06-17 HISTORY — DX: Essential (primary) hypertension: I10

## 2016-06-17 HISTORY — DX: Other pulmonary embolism without acute cor pulmonale: I26.99

## 2016-06-17 HISTORY — DX: Malignant (primary) neoplasm, unspecified: C80.1

## 2016-06-17 LAB — COMPREHENSIVE METABOLIC PANEL
ALBUMIN: 2.8 g/dL — AB (ref 3.5–5.0)
ALK PHOS: 257 U/L — AB (ref 38–126)
ALT: 42 U/L (ref 17–63)
AST: 28 U/L (ref 15–41)
Anion gap: 11 (ref 5–15)
BUN: 14 mg/dL (ref 6–20)
CALCIUM: 8.4 mg/dL — AB (ref 8.9–10.3)
CO2: 31 mmol/L (ref 22–32)
CREATININE: 0.86 mg/dL (ref 0.61–1.24)
Chloride: 87 mmol/L — ABNORMAL LOW (ref 101–111)
GFR calc non Af Amer: >60 mL/min (ref >60)
GLUCOSE: 164 mg/dL — AB (ref 65–99)
Potassium: 3.7 mmol/L (ref 3.5–5.1)
SODIUM: 129 mmol/L — AB (ref 135–145)
Total Bilirubin: 0.4 mg/dL (ref 0.3–1.2)
Total Protein: 6.1 g/dL — ABNORMAL LOW (ref 6.5–8.1)

## 2016-06-17 LAB — CBC WITH DIFFERENTIAL/PLATELET
BASOS PCT: 0 %
Basophils Absolute: 0 10*3/uL (ref 0–0.1)
Eosinophils Absolute: 0.1 10*3/uL (ref 0–0.7)
Eosinophils Relative: 1 %
HCT: 39.9 % — ABNORMAL LOW (ref 40.0–52.0)
Hemoglobin: 13.5 g/dL (ref 13.0–18.0)
Lymphocytes Relative: 2 %
Lymphs Abs: 0.4 10*3/uL — ABNORMAL LOW (ref 1.0–3.6)
MCH: 26.6 pg (ref 26.0–34.0)
MCHC: 33.7 g/dL (ref 32.0–36.0)
MCV: 78.9 fL — ABNORMAL LOW (ref 80.0–100.0)
MONO ABS: 1.8 10*3/uL — AB (ref 0.2–1.0)
MONOS PCT: 8 %
Neutro Abs: 20 10*3/uL — ABNORMAL HIGH (ref 1.4–6.5)
Neutrophils Relative %: 89 %
Platelets: 257 10*3/uL (ref 150–440)
RBC: 5.06 MIL/uL (ref 4.40–5.90)
RDW: 16.9 % — AB (ref 11.5–14.5)
WBC: 22.3 10*3/uL — ABNORMAL HIGH (ref 3.8–10.6)

## 2016-06-17 MED ORDER — VANCOMYCIN HCL IN DEXTROSE 1-5 GM/200ML-% IV SOLN
1000.0000 mg | Freq: Once | INTRAVENOUS | Status: AC
  Administered 2016-06-18: 1000 mg via INTRAVENOUS
  Filled 2016-06-17: qty 200

## 2016-06-17 MED ORDER — PIPERACILLIN-TAZOBACTAM 3.375 G IVPB 30 MIN
3.3750 g | Freq: Once | INTRAVENOUS | Status: AC
  Administered 2016-06-17: 3.375 g via INTRAVENOUS
  Filled 2016-06-17: qty 50

## 2016-06-17 NOTE — ED Triage Notes (Addendum)
Per [facility]: pt from home d/t c/o aspiration while drinking water. Lung CA (followed at [facility])-Right side. VSS in route. 4LO2NC baseline

## 2016-06-18 ENCOUNTER — Other Ambulatory Visit: Payer: Self-pay | Admitting: Family Medicine

## 2016-06-18 ENCOUNTER — Encounter: Payer: Self-pay | Admitting: Internal Medicine

## 2016-06-18 DIAGNOSIS — Z833 Family history of diabetes mellitus: Secondary | ICD-10-CM | POA: Diagnosis not present

## 2016-06-18 DIAGNOSIS — Z86711 Personal history of pulmonary embolism: Secondary | ICD-10-CM | POA: Diagnosis not present

## 2016-06-18 DIAGNOSIS — E871 Hypo-osmolality and hyponatremia: Secondary | ICD-10-CM | POA: Diagnosis present

## 2016-06-18 DIAGNOSIS — J690 Pneumonitis due to inhalation of food and vomit: Secondary | ICD-10-CM | POA: Diagnosis present

## 2016-06-18 DIAGNOSIS — R0602 Shortness of breath: Secondary | ICD-10-CM | POA: Diagnosis present

## 2016-06-18 DIAGNOSIS — Z86010 Personal history of colonic polyps: Secondary | ICD-10-CM | POA: Diagnosis not present

## 2016-06-18 DIAGNOSIS — T380X5A Adverse effect of glucocorticoids and synthetic analogues, initial encounter: Secondary | ICD-10-CM | POA: Diagnosis present

## 2016-06-18 DIAGNOSIS — J9621 Acute and chronic respiratory failure with hypoxia: Secondary | ICD-10-CM | POA: Diagnosis present

## 2016-06-18 DIAGNOSIS — Z9981 Dependence on supplemental oxygen: Secondary | ICD-10-CM | POA: Diagnosis not present

## 2016-06-18 DIAGNOSIS — C3490 Malignant neoplasm of unspecified part of unspecified bronchus or lung: Secondary | ICD-10-CM | POA: Diagnosis present

## 2016-06-18 DIAGNOSIS — F329 Major depressive disorder, single episode, unspecified: Secondary | ICD-10-CM | POA: Diagnosis present

## 2016-06-18 DIAGNOSIS — J189 Pneumonia, unspecified organism: Secondary | ICD-10-CM | POA: Diagnosis present

## 2016-06-18 DIAGNOSIS — I10 Essential (primary) hypertension: Secondary | ICD-10-CM | POA: Diagnosis present

## 2016-06-18 DIAGNOSIS — E785 Hyperlipidemia, unspecified: Secondary | ICD-10-CM | POA: Diagnosis present

## 2016-06-18 DIAGNOSIS — K219 Gastro-esophageal reflux disease without esophagitis: Secondary | ICD-10-CM | POA: Diagnosis present

## 2016-06-18 DIAGNOSIS — Z885 Allergy status to narcotic agent status: Secondary | ICD-10-CM | POA: Diagnosis not present

## 2016-06-18 DIAGNOSIS — Z8249 Family history of ischemic heart disease and other diseases of the circulatory system: Secondary | ICD-10-CM | POA: Diagnosis not present

## 2016-06-18 DIAGNOSIS — E860 Dehydration: Secondary | ICD-10-CM | POA: Diagnosis present

## 2016-06-18 DIAGNOSIS — G4730 Sleep apnea, unspecified: Secondary | ICD-10-CM | POA: Diagnosis present

## 2016-06-18 DIAGNOSIS — D72829 Elevated white blood cell count, unspecified: Secondary | ICD-10-CM | POA: Diagnosis present

## 2016-06-18 LAB — URINALYSIS, COMPLETE (UACMP) WITH MICROSCOPIC
Bilirubin Urine: NEGATIVE
Glucose, UA: NEGATIVE mg/dL
HGB URINE DIPSTICK: NEGATIVE
KETONES UR: NEGATIVE mg/dL
LEUKOCYTES UA: NEGATIVE
NITRITE: NEGATIVE
Protein, ur: 30 mg/dL — AB
SPECIFIC GRAVITY, URINE: 1.008 (ref 1.005–1.030)
SQUAMOUS EPITHELIAL / LPF: NONE SEEN
pH: 6 (ref 5.0–8.0)

## 2016-06-18 LAB — CBC
HEMATOCRIT: 37.3 % — AB (ref 40.0–52.0)
HEMOGLOBIN: 12.8 g/dL — AB (ref 13.0–18.0)
MCH: 26.9 pg (ref 26.0–34.0)
MCHC: 34.3 g/dL (ref 32.0–36.0)
MCV: 78.5 fL — AB (ref 80.0–100.0)
Platelets: 216 10*3/uL (ref 150–440)
RBC: 4.74 MIL/uL (ref 4.40–5.90)
RDW: 17 % — ABNORMAL HIGH (ref 11.5–14.5)
WBC: 14 10*3/uL — ABNORMAL HIGH (ref 3.8–10.6)

## 2016-06-18 LAB — CREATININE, SERUM
Creatinine, Ser: 0.94 mg/dL (ref 0.61–1.24)
GFR calc Af Amer: >60 mL/min (ref >60)
GFR calc non Af Amer: >60 mL/min (ref >60)

## 2016-06-18 LAB — BLOOD GAS, VENOUS
ACID-BASE EXCESS: 11.2 mmol/L — AB (ref 0.0–2.0)
BICARBONATE: 37 mmol/L — AB (ref 20.0–28.0)
O2 SAT: 92.7 %
PCO2 VEN: 52 mmHg (ref 44.0–60.0)
PO2 VEN: 62 mmHg — AB (ref 32.0–45.0)
Patient temperature: 37
pH, Ven: 7.46 — ABNORMAL HIGH (ref 7.250–7.430)

## 2016-06-18 LAB — LACTIC ACID, PLASMA: Lactic Acid, Venous: 1.4 mmol/L (ref 0.5–1.9)

## 2016-06-18 MED ORDER — ENOXAPARIN SODIUM 100 MG/ML ~~LOC~~ SOLN
1.0000 mg/kg | Freq: Two times a day (BID) | SUBCUTANEOUS | Status: DC
  Administered 2016-06-18 – 2016-06-19 (×3): 95 mg via SUBCUTANEOUS
  Filled 2016-06-18 (×4): qty 1

## 2016-06-18 MED ORDER — MAGIC MOUTHWASH
5.0000 mL | Freq: Four times a day (QID) | ORAL | Status: DC | PRN
  Filled 2016-06-18: qty 10

## 2016-06-18 MED ORDER — PREDNISONE 20 MG PO TABS
20.0000 mg | ORAL_TABLET | Freq: Every day | ORAL | Status: DC
  Administered 2016-06-18 – 2016-06-19 (×2): 20 mg via ORAL
  Filled 2016-06-18 (×2): qty 1

## 2016-06-18 MED ORDER — SODIUM CHLORIDE 0.9% FLUSH: 3.0000 mL | INTRAVENOUS | Status: DC | PRN

## 2016-06-18 MED ORDER — CLINDAMYCIN PHOSPHATE 600 MG/50ML IV SOLN
600.0000 mg | Freq: Four times a day (QID) | INTRAVENOUS | Status: DC
  Administered 2016-06-18 – 2016-06-19 (×5): 600 mg via INTRAVENOUS
  Filled 2016-06-18 (×7): qty 50

## 2016-06-18 MED ORDER — LEVOFLOXACIN IN D5W 750 MG/150ML IV SOLN
750.0000 mg | Freq: Once | INTRAVENOUS | Status: AC
  Administered 2016-06-18: 12:00:00 750 mg via INTRAVENOUS
  Filled 2016-06-18: qty 150

## 2016-06-18 MED ORDER — SODIUM CHLORIDE 0.9% FLUSH
3.0000 mL | Freq: Two times a day (BID) | INTRAVENOUS | Status: DC
  Administered 2016-06-18 – 2016-06-19 (×2): 3 mL via INTRAVENOUS

## 2016-06-18 MED ORDER — ALBUTEROL SULFATE (2.5 MG/3ML) 0.083% IN NEBU: 2.5000 mg | INHALATION_SOLUTION | RESPIRATORY_TRACT | Status: DC | PRN

## 2016-06-18 MED ORDER — CITALOPRAM HYDROBROMIDE 20 MG PO TABS
20.0000 mg | ORAL_TABLET | Freq: Every day | ORAL | Status: DC
  Administered 2016-06-18 – 2016-06-19 (×2): 20 mg via ORAL
  Filled 2016-06-18 (×2): qty 1

## 2016-06-18 MED ORDER — LORAZEPAM 0.5 MG PO TABS: 0.5000 mg | ORAL_TABLET | Freq: Three times a day (TID) | ORAL | Status: DC | PRN

## 2016-06-18 MED ORDER — LEVOFLOXACIN IN D5W 750 MG/150ML IV SOLN
750.0000 mg | INTRAVENOUS | Status: DC
  Administered 2016-06-19: 750 mg via INTRAVENOUS
  Filled 2016-06-18: qty 150

## 2016-06-18 MED ORDER — ONDANSETRON HCL 4 MG PO TABS: 4.0000 mg | ORAL_TABLET | Freq: Four times a day (QID) | ORAL | Status: DC | PRN

## 2016-06-18 MED ORDER — ONDANSETRON HCL 4 MG/2ML IJ SOLN: 4.0000 mg | Freq: Four times a day (QID) | INTRAMUSCULAR | Status: DC | PRN

## 2016-06-18 MED ORDER — PANTOPRAZOLE SODIUM 40 MG PO TBEC
40.0000 mg | DELAYED_RELEASE_TABLET | Freq: Every day | ORAL | Status: DC
  Administered 2016-06-18 – 2016-06-19 (×2): 40 mg via ORAL
  Filled 2016-06-18 (×2): qty 1

## 2016-06-18 MED ORDER — OXYCODONE HCL 5 MG PO TABS
10.0000 mg | ORAL_TABLET | ORAL | Status: DC | PRN
  Administered 2016-06-18 – 2016-06-19 (×4): 10 mg via ORAL
  Filled 2016-06-18 (×4): qty 2

## 2016-06-18 MED ORDER — CLONAZEPAM 0.5 MG PO TABS
0.5000 mg | ORAL_TABLET | Freq: Two times a day (BID) | ORAL | Status: DC
  Administered 2016-06-18 – 2016-06-19 (×3): 0.5 mg via ORAL
  Filled 2016-06-18 (×3): qty 1

## 2016-06-18 MED ORDER — SODIUM CHLORIDE 0.9 % IV SOLN: 250.0000 mL | INTRAVENOUS | Status: DC | PRN

## 2016-06-18 MED ORDER — OSIMERTINIB MESYLATE 80 MG PO TABS
80.0000 mg | ORAL_TABLET | Freq: Every day | ORAL | Status: DC
  Administered 2016-06-19: 08:00:00 80 mg via ORAL
  Filled 2016-06-18: qty 1

## 2016-06-18 MED ORDER — AMLODIPINE BESYLATE 5 MG PO TABS
10.0000 mg | ORAL_TABLET | Freq: Every day | ORAL | Status: DC
  Administered 2016-06-18 – 2016-06-19 (×2): 10 mg via ORAL
  Filled 2016-06-18 (×2): qty 2

## 2016-06-18 MED ORDER — SIMETHICONE 80 MG PO CHEW
80.0000 mg | CHEWABLE_TABLET | Freq: Four times a day (QID) | ORAL | Status: DC | PRN
  Filled 2016-06-18: qty 1

## 2016-06-18 MED ORDER — SODIUM CHLORIDE 0.9 % IV SOLN
INTRAVENOUS | Status: DC
  Administered 2016-06-18 – 2016-06-19 (×2): via INTRAVENOUS

## 2016-06-18 MED ORDER — FOLIC ACID 1 MG PO TABS
1.0000 mg | ORAL_TABLET | Freq: Every day | ORAL | Status: DC
  Administered 2016-06-18 – 2016-06-19 (×2): 1 mg via ORAL
  Filled 2016-06-18 (×2): qty 1

## 2016-06-18 MED ORDER — OXYCODONE ER 9 MG PO C12A: 18.0000 mg | EXTENDED_RELEASE_CAPSULE | Freq: Two times a day (BID) | ORAL | Status: DC

## 2016-06-18 MED ORDER — SENNOSIDES-DOCUSATE SODIUM 8.6-50 MG PO TABS
2.0000 | ORAL_TABLET | Freq: Two times a day (BID) | ORAL | Status: DC
  Administered 2016-06-18 – 2016-06-19 (×3): 2 via ORAL
  Filled 2016-06-18 (×3): qty 2

## 2016-06-18 MED ORDER — OXYCODONE HCL ER 10 MG PO T12A
20.0000 mg | EXTENDED_RELEASE_TABLET | Freq: Two times a day (BID) | ORAL | Status: DC
  Administered 2016-06-18 – 2016-06-19 (×3): 20 mg via ORAL
  Filled 2016-06-18 (×3): qty 2

## 2016-06-18 MED ORDER — ROSUVASTATIN CALCIUM 20 MG PO TABS
20.0000 mg | ORAL_TABLET | Freq: Every day | ORAL | Status: DC
  Administered 2016-06-18 – 2016-06-19 (×2): 20 mg via ORAL
  Filled 2016-06-18 (×2): qty 1

## 2016-06-18 MED ORDER — ACETAMINOPHEN 650 MG RE SUPP: 650.0000 mg | Freq: Four times a day (QID) | RECTAL | Status: DC | PRN

## 2016-06-18 MED ORDER — ACETAMINOPHEN 325 MG PO TABS: 650.0000 mg | ORAL_TABLET | Freq: Four times a day (QID) | ORAL | Status: DC | PRN

## 2016-06-18 NOTE — Progress Notes (Signed)
Patient requested to self manage pleurx catheter drain; at 1930 466m amber (slightly-red tinged) was drained from site on left side. Container disposed appropriately. Patient using own supplies from home. BMadlyn Frankel RN

## 2016-06-18 NOTE — Progress Notes (Signed)
Patient requesting to take home medications. Discussed this request with attending MD Manuella Ghazi) and pharmacist Santiago Glad. Patient may take home chemotherapy medication but it is highly discouraged for patient to take other medications from home that may be provided by the hospital pharmacy. Patient made aware and agreed to take medication provided by the hospital pharmacy with exception to the chemotherapy drug Tagrisso. Tiffany, RN to escort medication to the pharmacy to be stored and provided to patient for daily administration while hospitalized. Medication list also provided to pharmacy for reconciliation. Madlyn Frankel, RN

## 2016-06-18 NOTE — Progress Notes (Signed)
[place] [facility] [place] NAME: Marlon Suleiman    MR#:  [id number]  DATE OF BIRTH:  April 23, 1965  SUBJECTIVE:  CHIEF COMPLAINT:   Chief Complaint  Patient presents with  . Aspiration  He is feeling much better, early on was getting anxious to go home now he is agreeable to stay.  His mother is at bedside REVIEW OF SYSTEMS:  Review of Systems  Constitutional: Positive for malaise/fatigue. Negative for chills, fever and weight loss.  HENT: Negative for nosebleeds and sore throat.   Eyes: Negative for blurred vision.  Respiratory: Positive for cough. Negative for shortness of breath and wheezing.   Cardiovascular: Negative for chest pain, orthopnea, leg swelling and PND.  Gastrointestinal: Negative for abdominal pain, constipation, diarrhea, heartburn, nausea and vomiting.  Genitourinary: Negative for dysuria and urgency.  Musculoskeletal: Negative for back pain.  Skin: Negative for rash.  Neurological: Positive for weakness. Negative for dizziness, speech change, focal weakness and headaches.  Endo/Heme/Allergies: Does not bruise/bleed easily.  Psychiatric/Behavioral: Negative for depression.    DRUG ALLERGIES:   Allergies  Allergen Reactions  . Morphine And Related     Passed out   VITALS:  Blood pressure (!) 145/98, pulse (!) 102, temperature 97.7 F (36.5 C), temperature source Oral, resp. rate 16, height 6' (1.829 m), weight 83.3 kg (183 lb 11.2 oz), SpO2 100 %. PHYSICAL EXAMINATION:  Physical Exam  Constitutional: He is oriented to person, place, and time and well-developed, well-nourished, and in no distress.  HENT:  Head: Normocephalic and atraumatic.  Eyes: Conjunctivae and EOM are normal. Pupils are equal, round, and reactive to light.  Neck: Normal range of motion. Neck supple. No tracheal deviation present. No thyromegaly present.  Cardiovascular: Normal rate, regular rhythm and normal heart sounds.   Pulmonary/Chest: Effort  normal. No respiratory distress. He has no wheezes. He has rhonchi in the right lower field. He exhibits no tenderness.  Abdominal: Soft. Bowel sounds are normal. He exhibits no distension. There is no tenderness.  Musculoskeletal: Normal range of motion.  Neurological: He is alert and oriented to person, place, and time. No cranial nerve deficit.  Skin: Skin is warm and dry. No rash noted.  Psychiatric: Mood and affect normal.   LABORATORY PANEL:  Male CBC  Recent Labs Lab 06/18/16 0829  WBC 14.0*  HGB 12.8*  HCT 37.3*  PLT 216   ------------------------------------------------------------------------------------------------------------------ Chemistries   Recent Labs Lab 06/17/16 2335 06/18/16 0829  NA 129*  --   K 3.7  --   CL 87*  --   CO2 31  --   GLUCOSE 164*  --   BUN 14  --   CREATININE 0.86 0.94  CALCIUM 8.4*  --   AST 28  --   ALT 42  --   ALKPHOS 257*  --   BILITOT 0.4  --    RADIOLOGY:  Dg Chest Port 1 View  Result Date: 06/17/2016 CLINICAL DATA:  Shortness of breath. Patient reports aspiration while drinking water tonight. EXAM: PORTABLE CHEST 1 VIEW COMPARISON:  Radiograph 02/19/2014.  CT 02/02/2014 FINDINGS: Right lung volume loss post right lobectomy. There patchy opacities throughout the aerated right lung that have increased from prior. Suspect small pleural effusion tracking laterally. There is a small left pleural effusion and peripheral left basilar opacity. Mild vascular congestion. Unchanged mediastinal contours, right mediastinal border is obscured. No evidence of pneumothorax. IMPRESSION: Postsurgical change in the right hemithorax. There are increased opacities throughout the  right lung that may be pneumonia or atelectasis. Streaky left lower lobe opacity with small left effusion. Vascular congestion. Cannot exclude an element of fluid overload/CHF. Electronically Signed   By: Jeb Levering M.D.   On: 06/17/2016 23:50   ASSESSMENT AND PLAN:    51 year old male patient with history of stage IV lung cancer, pulmonary embolism on Lovenox injections, gout, hyperlipidemia, thyroid nodule presented to the emergency room with shortness of breath and cough after aspirating while drinking water.  *Acute aspiration event with likely Aspiration pneumonitis/pneumonia: Continue IV clindamycin and Levaquin for now  * Hyponatremia: Likely due to dehydration -Started on IV normal saline and monitor sodium  * Leukocytosis: Likely secondary to steroids  * Stage IV lung cancer: Followed at [facility] cancer center  * History of pulmonary embolism -On full dose anticoagulation with subcutaneous Lovenox 1 mg per KG subcutaneous every 12 hourly per [facility] oncology -He states he does have a follow-up appointment coming Tuesday with his [facility] oncology  He has some rehearsal for his daughter on Saturday and he really wants to leave before that   All the records are reviewed and case discussed with Care Management/Social Worker. Management plans discussed with the patient, family and they are in agreement.  CODE STATUS: Full Code  TOTAL TIME TAKING CARE OF THIS PATIENT: 35 minutes.   More than 50% of the time was spent in counseling/coordination of care: YES  POSSIBLE D/C IN 1-2 DAYS, DEPENDING ON CLINICAL CONDITION.   Max Sane M.D on 06/18/2016 at 12:04 PM  Between 7am to 6pm - Pager - [phone]  After 6pm go to [email or web address] - Proofreader  [facility]  Office  [phone]  CC: Primary care physician; Tommi Rumps, MD  Note: This dictation was prepared with Dragon dictation along with smaller phrase technology. Any transcriptional errors that result from this process are unintentional.

## 2016-06-18 NOTE — Progress Notes (Signed)
Pharmacy Antibiotic Note  Robert Morse is a 51 y.o. male admitted on 06/17/2016 with CAP.  Pharmacy has been consulted for levofloxacin dosing.  Plan: Spoke to Dr. Estanislado Pandy - discontinue Zosyn and vancomycin consults/orders. Start levofloxacin 750 mg IV Q24H  Height: 6' (182.9 cm) Weight: 205 lb (93 kg) IBW/kg (Calculated) : 77.6  Temp (24hrs), Avg:98 F (36.7 C), Min:98 F (36.7 C), Max:98 F (36.7 C)   Recent Labs Lab 06/17/16 2335 06/17/16 2350  WBC 22.3*  --   CREATININE 0.86  --   LATICACIDVEN  --  1.4    Estimated Creatinine Clearance: 111.5 mL/min (by C-G formula based on SCr of 0.86 mg/dL).    Allergies  Allergen Reactions  . Morphine And Related     Passed out     Thank you for allowing pharmacy to be a part of this patient's care.  Laural Benes, Pharm.D., BCPS Clinical Pharmacist 06/18/2016 6:59 AM

## 2016-06-18 NOTE — H&P (Signed)
[facility] [place] NAME: Robert Morse    MR#:  [id number]  DATE OF BIRTH:  12-16-1965  DATE OF ADMISSION:  06/17/2016  PRIMARY CARE PHYSICIAN: Tommi Rumps, MD   REQUESTING/REFERRING PHYSICIAN:   CHIEF COMPLAINT:   Chief Complaint  Patient presents with  . Aspiration    HISTORY OF PRESENT ILLNESS: Robert Morse  is a 51 y.o. male with a known history of Stage IV lung cancer, GERD, gout, hyperlipidemia, hypertension, sleep apnea who is on home oxygen presented to the emergency room with shortness of breath and cough. Patient tried drinking water yesterday evening and he aspirated and noted severe cough and shortness of breath. He presented to the emergency room he was put initially on BiPAP for respiratory distress and later on weaned to oxygen via nasal cannula. No complaints of any fever, chills. Has some gurgling sound in the chest whenever he coughs. Patient was given IV vancomycin and Zosyn antibiotics in the emergency room. He was comfortable Lanoxin by nasal cannula at 4 L. Patient was recently diagnosed with pulmonary embolism at [facility] and is currently on Lovenox injections subcutaneously twice daily. Hospitalist service was consulted for further care of the patient. Patient was worked up with chest x-ray which showed a bilateral infiltrates lower lobes which could possibly be secondary to aspiration pneumonitis. Superimposed pneumonia could also be present. Vision currently on oral chemotherapy pill for lung cancer.  PAST MEDICAL HISTORY:   Past Medical History:  Diagnosis Date  . Atherosclerosis   . Cancer (HCC)    LUNG   . Depression    takes Celexa daily  . GERD (gastroesophageal reflux disease)    takes Omeprazole daily  . History of bronchitis    Aug 2014  . History of gout   . Hyperlipidemia    takes Crestor daily  . Hypertension   . Lung cancer ([place])   . Polyp of colon    Dr. Candace Cruise at [place]  .  Pulmonary embolism ([place])   . Shortness of breath    with exertion  . Sinus infection    takes Augmentin daily  . Sleep apnea    uses a cpap;sleep study done about a yr and a half ago  . Thyroid nodule    small polyp on thyroid    PAST SURGICAL HISTORY: Past Surgical History:  Procedure Laterality Date  . CARDIAC CATHETERIZATION  11/2007   [facility]: Normal coronary arteries.  . COLONOSCOPY    . ESOPHAGOGASTRODUODENOSCOPY    . LUNG CANCER SURGERY  1.28.15  . MENISCUS REPAIR  2012   left knee, Dr. Judi Cong at [facility]  . NASAL SEPTUM SURGERY  7-77yr ago   Dr. CCarlis Abbott . TONSILLECTOMY AND ADENOIDECTOMY  1973   adenoids as a child    SOCIAL HISTORY:  Social History  Substance Use Topics  . Smoking status: Never Smoker  . Smokeless tobacco: Never Used  . Alcohol use No    FAMILY HISTORY:  Family History  Problem Relation Age of Onset  . Heart disease Father     s/p CABG  . Hypertension Father   . Diabetes Father   . Hyperlipidemia Father   . Heart attack Father   . Hyperlipidemia Maternal Grandfather   . Heart disease Maternal Grandfather   . Hypertension Maternal Grandfather   . Heart disease Maternal Uncle   . Healthy Mother   . Hyperlipidemia Maternal Grandmother   . Hypertension Maternal Grandmother   .  Cancer Other     cancer  . Diabetes Other   . Heart disease Paternal Uncle   . Heart disease Paternal Grandfather     DRUG ALLERGIES:  Allergies  Allergen Reactions  . Morphine And Related     Passed out    REVIEW OF SYSTEMS:   CONSTITUTIONAL: No fever, has weakness.  EYES: No blurred or double vision.  EARS, NOSE, AND THROAT: No tinnitus or ear pain.  RESPIRATORY: Has cough, shortness of breath, wheezing  No hemoptysis.  CARDIOVASCULAR: No chest pain, orthopnea, edema.  GASTROINTESTINAL: No nausea, vomiting, diarrhea or abdominal pain.  GENITOURINARY: No dysuria, hematuria.  ENDOCRINE: No polyuria, nocturia,  HEMATOLOGY: No anemia, easy bruising  or bleeding SKIN: No rash or lesion. MUSCULOSKELETAL: No joint pain or arthritis.   NEUROLOGIC: No tingling, numbness, weakness.  PSYCHIATRY: No anxiety or depression.   MEDICATIONS AT HOME:  Prior to Admission medications   Medication Sig Start Date End Date Taking? Authorizing Provider  amLODipine (NORVASC) 10 MG tablet Take 1 tablet (10 mg total) by mouth daily. 05/21/16  Yes Leone Haven, MD  citalopram (CELEXA) 20 MG tablet TAKE 1 TABLET (20 MG TOTAL) BY MOUTH DAILY. 12/30/15  Yes Leone Haven, MD  clonazePAM (KLONOPIN) 0.5 MG tablet Take 0.5 mg by mouth 2 (two) times daily.   Yes Historical Provider, MD  doxycycline (MONODOX) 100 MG capsule Take 100 mg by mouth daily.   Yes Historical Provider, MD  folic acid (FOLVITE) 1 MG tablet Take 1 mg by mouth daily.   Yes Historical Provider, MD  hydrocortisone 2.5 % cream Apply 1 application topically 2 (two) times daily.   Yes Historical Provider, MD  lactulose (CHRONULAC) 10 GM/15ML solution Take 30 mLs by mouth daily as needed.   Yes Historical Provider, MD  LORazepam (ATIVAN) 0.5 MG tablet Take 0.5 mg by mouth 3 (three) times daily as needed for anxiety.   Yes Historical Provider, MD  magic mouthwash SOLN Take 5 mLs by mouth 4 (four) times daily as needed for mouth pain.   Yes Historical Provider, MD  OxyCODONE ER 9 MG C12A Take 18 mg by mouth 2 (two) times daily.   Yes Historical Provider, MD  Oxycodone HCl 10 MG TABS Take 10 mg by mouth every 4 (four) hours as needed for pain.   Yes Historical Provider, MD  pantoprazole (PROTONIX) 40 MG tablet Take 40 mg by mouth daily.   Yes Historical Provider, MD  predniSONE (DELTASONE) 10 MG tablet Take 10 mg by mouth as directed. 3 tablets twice daily x 5 days (total 7 days) then decrease to 2 tablets twice daily x 7 days, 1 tablet twice daily x 7 days, 1 tablet daily x 7 days, 1/2 tablet dialy x 7 days then stop   Yes Historical Provider, MD  rosuvastatin (CRESTOR) 20 MG tablet Take 1 tablet (20  mg total) by mouth daily. 05/31/15  Yes Jackolyn Confer, MD  senna-docusate (SENOKOT-S) 8.6-50 MG tablet Take 2 tablets by mouth 2 (two) times daily.   Yes Historical Provider, MD  simethicone (MYLICON) 80 MG chewable tablet Chew 80 mg by mouth every 6 (six) hours as needed for flatulence.   Yes Historical Provider, MD  sulfamethoxazole-trimethoprim (BACTRIM DS,SEPTRA DS) 800-160 MG tablet Take 1 tablet by mouth every Monday, Wednesday, and Friday.   Yes Historical Provider, MD  TAGRISSO 80 MG tablet Take 80 mg by mouth daily.   Yes Historical Provider, MD  doxycycline (VIBRA-TABS) 100 MG tablet Take  1 tablet (100 mg total) by mouth 2 (two) times daily. Patient not taking: Reported on 12/30/2015 02/19/14   Jackolyn Confer, MD  valACYclovir (VALTREX) 500 MG tablet Take 1 tablet (500 mg total) by mouth daily as needed. 05/31/15   Jackolyn Confer, MD      PHYSICAL EXAMINATION:   VITAL SIGNS: Blood pressure (!) 130/96, pulse 98, temperature 98 F (36.7 C), temperature source Axillary, resp. rate 11, height 6' (1.829 m), weight 93 kg (205 lb), SpO2 99 %.  GENERAL:  51 y.o.-year-old patient lying in the bed with no acute distress.  EYES: Pupils equal, round, reactive to light and accommodation. No scleral icterus. Extraocular muscles intact.  HEENT: Head atraumatic, normocephalic. Oropharynx and nasopharynx clear.  NECK:  Supple, no jugular venous distention. No thyroid enlargement, no tenderness.  LUNGS: Decreased breath sounds bilaterally, scattered rales heard in both lungs.. No use of accessory muscles of respiration.  CARDIOVASCULAR: S1, S2 normal. No murmurs, rubs, or gallops.  ABDOMEN: Soft, nontender, nondistended. Bowel sounds present. No organomegaly or mass. Has abdominal drain.  EXTREMITIES: No pedal edema, cyanosis, or clubbing.  NEUROLOGIC: Cranial nerves II through XII are intact. Muscle strength 5/5 in all extremities. Sensation intact. Gait not checked.  PSYCHIATRIC: The  patient is alert and oriented x 3.  SKIN: No obvious rash, lesion, or ulcer.   LABORATORY PANEL:   CBC  Recent Labs Lab 06/17/16 2335  WBC 22.3*  HGB 13.5  HCT 39.9*  PLT 257  MCV 78.9*  MCH 26.6  MCHC 33.7  RDW 16.9*  LYMPHSABS 0.4*  MONOABS 1.8*  EOSABS 0.1  BASOSABS 0.0   ------------------------------------------------------------------------------------------------------------------  Chemistries   Recent Labs Lab 06/17/16 2335  NA 129*  K 3.7  CL 87*  CO2 31  GLUCOSE 164*  BUN 14  CREATININE 0.86  CALCIUM 8.4*  AST 28  ALT 42  ALKPHOS 257*  BILITOT 0.4   ------------------------------------------------------------------------------------------------------------------ estimated creatinine clearance is 111.5 mL/min (by C-G formula based on SCr of 0.86 mg/dL). ------------------------------------------------------------------------------------------------------------------ No results for input(s): TSH, T4TOTAL, T3FREE, THYROIDAB in the last 72 hours.  Invalid input(s): FREET3   Coagulation profile No results for input(s): INR, PROTIME in the last 168 hours. ------------------------------------------------------------------------------------------------------------------- No results for input(s): DDIMER in the last 72 hours. -------------------------------------------------------------------------------------------------------------------  Cardiac Enzymes No results for input(s): CKMB, TROPONINI, MYOGLOBIN in the last 168 hours.  Invalid input(s): CK ------------------------------------------------------------------------------------------------------------------ Invalid input(s): POCBNP  ---------------------------------------------------------------------------------------------------------------  Urinalysis    Component Value Date/Time   COLORURINE YELLOW (A) 06/18/2016 0002   APPEARANCEUR CLEAR (A) 06/18/2016 0002   LABSPEC 1.008 06/18/2016  0002   PHURINE 6.0 06/18/2016 0002   GLUCOSEU NEGATIVE 06/18/2016 0002   HGBUR NEGATIVE 06/18/2016 0002   BILIRUBINUR NEGATIVE 06/18/2016 0002   BILIRUBINUR neg 07/14/2013 1147   KETONESUR NEGATIVE 06/18/2016 0002   PROTEINUR 30 (A) 06/18/2016 0002   UROBILINOGEN 0.2 07/14/2013 1147   NITRITE NEGATIVE 06/18/2016 0002   LEUKOCYTESUR NEGATIVE 06/18/2016 0002     RADIOLOGY: Dg Chest Port 1 View  Result Date: 06/17/2016 CLINICAL DATA:  Shortness of breath. Patient reports aspiration while drinking water tonight. EXAM: PORTABLE CHEST 1 VIEW COMPARISON:  Radiograph 02/19/2014.  CT 02/02/2014 FINDINGS: Right lung volume loss post right lobectomy. There patchy opacities throughout the aerated right lung that have increased from prior. Suspect small pleural effusion tracking laterally. There is a small left pleural effusion and peripheral left basilar opacity. Mild vascular congestion. Unchanged mediastinal contours, right mediastinal border is obscured. No evidence of pneumothorax. IMPRESSION:  Postsurgical change in the right hemithorax. There are increased opacities throughout the right lung that may be pneumonia or atelectasis. Streaky left lower lobe opacity with small left effusion. Vascular congestion. Cannot exclude an element of fluid overload/CHF. Electronically Signed   By: Jeb Levering M.D.   On: 06/17/2016 23:50    EKG: Orders placed or performed during the hospital encounter of 06/17/16  . EKG 12-Lead  . EKG 12-Lead  . ED EKG  . ED EKG  . ED EKG 12-Lead  . ED EKG 12-Lead    IMPRESSION AND PLAN: 51 year old male patient with history of stage IV lung cancer, pulmonary embolism on Lovenox injections, gout, hyperlipidemia, thyroid nodule presented to the emergency room with shortness of breath and cough after aspirating while drinking water. Admitting diagnosis 1. Acute respiratory distress secondary to aspiration 2. Aspiration pneumonitis 3. Hyponatremia 4. Leukocytosis  secondary to steroids 5. Stage IV lung cancer 6. History of pulmonary embolism Treatment plan Admit patient to medical floor Resume anticoagulation with subcutaneous Lovenox 1 mg per KG subcutaneous every 12 hourly Empirical antibiotics with the IV Levaquin and IV clindamycin Follow-up sodium level Resume steroids orally Continue chemotherapy pills for lung cancer Albuterol nebulization treatment as needed Supportive care   All the records are reviewed and case discussed with ED provider. Management plans discussed with the patient, family and they are in agreement.  CODE STATUS:FULL CODE Code Status History    This patient does not have a recorded code status. Please follow your organizational policy for patients in this situation.       TOTAL TIME TAKING CARE OF THIS PATIENT: 53 minutes.    Saundra Shelling M.D on 06/18/2016 at 6:17 AM  Between 7am to 6pm - Pager - [phone]  After 6pm go to [email or web address] - password [email or web address] [facility]  [place] [facility]  Office  [phone]  CC: Primary care physician; Tommi Rumps, MD

## 2016-06-18 NOTE — Progress Notes (Signed)
Pharmacy Antibiotic Note  Robert Morse is a 51 y.o. male admitted on 06/17/2016 with sepsis.  Pharmacy has been consulted for Zosyn and vancomycin dosing.  Plan: 1. Zosyn 3.375 gm IV Q8H EI 2. Vancomycin 1 gm IV x 1 followed in approximately 6 hours (stacked dosing) by vancomycin 1 gm IV Q8H, predicted trough 16 mcg/mL. Pharmacy will continue to follow and adjust as needed to maintain trough 15 to 20 mcg/mL.   Vd 58.6 L, Ke 0.097 hr-1, T1/2 7.1 hr  Height: 6' (182.9 cm) Weight: 205 lb (93 kg) IBW/kg (Calculated) : 77.6  Temp (24hrs), Avg:98 F (36.7 C), Min:98 F (36.7 C), Max:98 F (36.7 C)   Recent Labs Lab 06/17/16 2335  WBC 22.3*  CREATININE 0.86    Estimated Creatinine Clearance: 111.5 mL/min (by C-G formula based on SCr of 0.86 mg/dL).    Allergies  Allergen Reactions  . Morphine And Related     Passed out    Thank you for allowing pharmacy to be a part of this patient's care.  Laural Benes, Pharm.D., BCPS Clinical Pharmacist 06/18/2016 12:04 AM

## 2016-06-18 NOTE — ED Notes (Signed)
[place], RN 1C states protected time to not accept report at this time.

## 2016-06-18 NOTE — Progress Notes (Signed)
Pt is on 4L [place] in no distress. Bipap is not at bedside.

## 2016-06-18 NOTE — ED Notes (Signed)
BiPap machine adjusted by EDT. Respiratory notified of alarm and instructed on how to fix. Adjustments made, alarms no longer alerting. Respiratory will be down to reassess when available.

## 2016-06-18 NOTE — Telephone Encounter (Signed)
Last OV 12/30/15 last filled 12/30/15 30 5rf fyi patient is currently admitted in hospital

## 2016-06-18 NOTE — ED Notes (Addendum)
Pt tolerating [place] 4L (home O2) well, color WNL, WOB decreased and WNL, pt states feels like baseline

## 2016-06-18 NOTE — ED Provider Notes (Signed)
[facility] Emergency Department Provider Note   First MD Initiated Contact with Patient 06/17/16 2320     (approximate)  I have reviewed the triage vital signs and the nursing notes.   HISTORY  Chief Complaint Aspiration    HPI Robert Morse is a 51 y.o. male with history of stage IV lung cancer presents to the emergency department via EMS rest or distress following "aspirating water". Per EMS on their arrival the patient with apparent respiratory distress oxygen saturation high 70s low 80s on 8 L nasal cannula. Patient's baseline O2 at home was 4 L via nasal cannula. Patient states that he's had episodes of aspiration in the past however never to this severity.   Past Medical History:  Diagnosis Date  . Atherosclerosis   . Cancer (HCC)    LUNG   . Depression    takes Celexa daily  . GERD (gastroesophageal reflux disease)    takes Omeprazole daily  . History of bronchitis    Aug 2014  . History of gout   . Hyperlipidemia    takes Crestor daily  . Hypertension   . Polyp of colon    Dr. Candace Cruise at [place]  . Shortness of breath    with exertion  . Sinus infection    takes Augmentin daily  . Sleep apnea    uses a cpap;sleep study done about a yr and a half ago  . Thyroid nodule    small polyp on thyroid    Patient Active Problem List   Diagnosis Date Noted  . Anxiety 12/30/2015  . Essential hypertension 05/31/2015  . Elevated liver enzymes 04/02/2015  . Diarrhea 04/01/2015  . Malignant neoplasm of unspecified part of right bronchus or lung ([place]) 04/26/2014  . Atherosclerosis 07/20/2013  . Routine general medical examination at a health care facility 07/14/2013  . Posterior neck pain 07/14/2013  . Neuropathic pain 06/19/2013  . Dyspnea 05/17/2013  . Coronary artery disease 05/02/2013  . Gout flare 12/28/2011  . Hyperlipidemia 10/29/2011  . Obesity (BMI 30-39.9) 10/29/2011  . Herpes genitalis 10/29/2011    Past Surgical History:    Procedure Laterality Date  . CARDIAC CATHETERIZATION  11/2007   [facility]: Normal coronary arteries.  . COLONOSCOPY    . ESOPHAGOGASTRODUODENOSCOPY    . LUNG CANCER SURGERY  1.28.15  . MENISCUS REPAIR  2012   left knee, Dr. Judi Cong at [facility]  . NASAL SEPTUM SURGERY  7-71yr ago   Dr. CCarlis Abbott . TONSILLECTOMY AND ADENOIDECTOMY  1973   adenoids as a child    Prior to Admission medications   Medication Sig Start Date End Date Taking? Authorizing Provider  amLODipine (NORVASC) 10 MG tablet Take 1 tablet (10 mg total) by mouth daily. 05/21/16  Yes ELeone Haven MD  citalopram (CELEXA) 20 MG tablet TAKE 1 TABLET (20 MG TOTAL) BY MOUTH DAILY. 12/30/15  Yes ELeone Haven MD  clonazePAM (KLONOPIN) 0.5 MG tablet Take 0.5 mg by mouth 2 (two) times daily.   Yes Historical Provider, MD  doxycycline (MONODOX) 100 MG capsule Take 100 mg by mouth daily.   Yes Historical Provider, MD  folic acid (FOLVITE) 1 MG tablet Take 1 mg by mouth daily.   Yes Historical Provider, MD  hydrocortisone 2.5 % cream Apply 1 application topically 2 (two) times daily.   Yes Historical Provider, MD  lactulose (CHRONULAC) 10 GM/15ML solution Take 30 mLs by mouth daily as needed.   Yes Historical Provider, MD  LORazepam (ATIVAN) 0.5 MG tablet Take 0.5 mg by mouth 3 (three) times daily as needed for anxiety.   Yes Historical Provider, MD  magic mouthwash SOLN Take 5 mLs by mouth 4 (four) times daily as needed for mouth pain.   Yes Historical Provider, MD  OxyCODONE ER 9 MG C12A Take 18 mg by mouth 2 (two) times daily.   Yes Historical Provider, MD  Oxycodone HCl 10 MG TABS Take 10 mg by mouth every 4 (four) hours as needed for pain.   Yes Historical Provider, MD  pantoprazole (PROTONIX) 40 MG tablet Take 40 mg by mouth daily.   Yes Historical Provider, MD  predniSONE (DELTASONE) 10 MG tablet Take 10 mg by mouth as directed. 3 tablets twice daily x 5 days (total 7 days) then decrease to 2 tablets twice daily x 7 days, 1  tablet twice daily x 7 days, 1 tablet daily x 7 days, 1/2 tablet dialy x 7 days then stop   Yes Historical Provider, MD  rosuvastatin (CRESTOR) 20 MG tablet Take 1 tablet (20 mg total) by mouth daily. 05/31/15  Yes Jackolyn Confer, MD  senna-docusate (SENOKOT-S) 8.6-50 MG tablet Take 2 tablets by mouth 2 (two) times daily.   Yes Historical Provider, MD  simethicone (MYLICON) 80 MG chewable tablet Chew 80 mg by mouth every 6 (six) hours as needed for flatulence.   Yes Historical Provider, MD  sulfamethoxazole-trimethoprim (BACTRIM DS,SEPTRA DS) 800-160 MG tablet Take 1 tablet by mouth every Monday, Wednesday, and Friday.   Yes Historical Provider, MD  TAGRISSO 80 MG tablet Take 80 mg by mouth daily.   Yes Historical Provider, MD  doxycycline (VIBRA-TABS) 100 MG tablet Take 1 tablet (100 mg total) by mouth 2 (two) times daily. Patient not taking: Reported on 12/30/2015 02/19/14   Jackolyn Confer, MD  valACYclovir (VALTREX) 500 MG tablet Take 1 tablet (500 mg total) by mouth daily as needed. 05/31/15   Jackolyn Confer, MD    Allergies Morphine and related  Family History  Problem Relation Age of Onset  . Heart disease Father     s/p CABG  . Hypertension Father   . Diabetes Father   . Hyperlipidemia Father   . Heart attack Father   . Hyperlipidemia Maternal Grandfather   . Heart disease Maternal Grandfather   . Hypertension Maternal Grandfather   . Heart disease Maternal Uncle   . Healthy Mother   . Hyperlipidemia Maternal Grandmother   . Hypertension Maternal Grandmother   . Cancer Other     cancer  . Diabetes Other   . Heart disease Paternal Uncle   . Heart disease Paternal Grandfather     Social History Social History  Substance Use Topics  . Smoking status: Never Smoker  . Smokeless tobacco: Never Used  . Alcohol use No    Review of Systems Constitutional: No fever/chills Eyes: No visual changes. ENT: No sore throat. Cardiovascular: Denies chest pain. Respiratory:  Positive for dyspnea and cough Gastrointestinal: No abdominal pain.  No nausea, no vomiting.  No diarrhea.  No constipation. Genitourinary: Negative for dysuria. Musculoskeletal: Negative for back pain. Skin: Negative for rash. Neurological: Negative for headaches, focal weakness or numbness.  10-point ROS otherwise negative.  ____________________________________________   PHYSICAL EXAM:  VITAL SIGNS: ED Triage Vitals  Enc Vitals Group     BP 06/17/16 2327 (!) 131/103     Pulse Rate 06/17/16 2327 (!) 125     Resp 06/17/16 2327 (!) 26  Temp 06/17/16 2327 98 F (36.7 C)     Temp Source 06/17/16 2327 Axillary     SpO2 06/17/16 2327 96 %     Weight 06/17/16 2328 205 lb (93 kg)     Height 06/17/16 2328 6' (1.829 m)     Head Circumference --      Peak Flow --      Pain Score --      Pain Loc --      Pain Edu? --      Excl. in [place]? --     Constitutional: Alert and oriented. Apparent respiratory distress Eyes: Conjunctivae are normal. PERRL. EOMI. Head: Atraumatic. Nose: No congestion/rhinnorhea. Mouth/Throat: Mucous membranes are moist.  Oropharynx non-erythematous. Neck: No stridor.  Cardiovascular: Tachycardia regular rhythm. Good peripheral circulation. Grossly normal heart sounds. Respiratory: Tachypnea, positive accessory respiratory muscle use, Gastrointestinal: Soft and nontender. No distention.  Musculoskeletal: No lower extremity tenderness nor edema. No gross deformities of extremities. Neurologic:  Normal speech and language. No gross focal neurologic deficits are appreciated.  Skin:  Skin is warm, dry and intact. No rash noted. Psychiatric: Mood and affect are normal. Speech and behavior are normal.  ____________________________________________   LABS (all labs ordered are listed, but only abnormal results are displayed)  Labs Reviewed  COMPREHENSIVE METABOLIC PANEL - Abnormal; Notable for the following:       Result Value   Sodium 129 (*)    Chloride  87 (*)    Glucose, Bld 164 (*)    Calcium 8.4 (*)    Total Protein 6.1 (*)    Albumin 2.8 (*)    Alkaline Phosphatase 257 (*)    All other components within normal limits  CBC WITH DIFFERENTIAL/PLATELET - Abnormal; Notable for the following:    WBC 22.3 (*)    HCT 39.9 (*)    MCV 78.9 (*)    RDW 16.9 (*)    Neutro Abs 20.0 (*)    Lymphs Abs 0.4 (*)    Monocytes Absolute 1.8 (*)    All other components within normal limits  URINALYSIS, COMPLETE (UACMP) WITH MICROSCOPIC - Abnormal; Notable for the following:    Color, Urine YELLOW (*)    APPearance CLEAR (*)    Protein, ur 30 (*)    Bacteria, UA RARE (*)    All other components within normal limits  BLOOD GAS, VENOUS - Abnormal; Notable for the following:    pH, Ven 7.46 (*)    pO2, Ven 62.0 (*)    Bicarbonate 37.0 (*)    Acid-Base Excess 11.2 (*)    All other components within normal limits  CULTURE, BLOOD (ROUTINE X 2)  CULTURE, BLOOD (ROUTINE X 2)  LACTIC ACID, PLASMA  LACTIC ACID, PLASMA   ____________________________________________  EKG  ED ECG REPORT I, Holden N BROWN, the attending physician, personally viewed and interpreted this ECG.   Date: 06/18/2016  EKG Time: 11:28 PM  Rate: 123  Rhythm: Sinus tachycardia  Axis: Normal  Intervals: Normal  ST&T Change: None  ____________________________________________  RADIOLOGY I,  Ernst Bowler, personally viewed and evaluated these images (plain radiographs) as part of my medical decision making, as well as reviewing the written report by the radiologist.  Dg Chest Port 1 View  Result Date: 06/17/2016 CLINICAL DATA:  Shortness of breath. Patient reports aspiration while drinking water tonight. EXAM: PORTABLE CHEST 1 VIEW COMPARISON:  Radiograph 02/19/2014.  CT 02/02/2014 FINDINGS: Right lung volume loss post right lobectomy. There patchy opacities throughout the aerated  right lung that have increased from prior. Suspect small pleural effusion tracking  laterally. There is a small left pleural effusion and peripheral left basilar opacity. Mild vascular congestion. Unchanged mediastinal contours, right mediastinal border is obscured. No evidence of pneumothorax. IMPRESSION: Postsurgical change in the right hemithorax. There are increased opacities throughout the right lung that may be pneumonia or atelectasis. Streaky left lower lobe opacity with small left effusion. Vascular congestion. Cannot exclude an element of fluid overload/CHF. Electronically Signed   By: Jeb Levering M.D.   On: 06/17/2016 23:50      Procedures   Critical Care performed: CRITICAL CARE Performed by: Gregor Hams   Total critical care time: 45 minutes  Critical care time was exclusive of separately billable procedures and treating other patients.  Critical care was necessary to treat or prevent imminent or life-threatening deterioration.  Critical care was time spent personally by me on the following activities: development of treatment plan with patient and/or surrogate as well as nursing, discussions with consultants, evaluation of patient's response to treatment, examination of patient, obtaining history from patient or surrogate, ordering and performing treatments and interventions, ordering and review of laboratory studies, ordering and review of radiographic studies, pulse oximetry and re-evaluation of patient's condition. ________________   INITIAL IMPRESSION / ASSESSMENT AND PLAN / ED COURSE  Pertinent labs & imaging results that were available during my care of the patient were reviewed by me and considered in my medical decision making (see chart for details).  51 year old male with stage IV lung cancer presenting to the emergency department rest or distress as such BiPAP was applied shortly after patient's arrival concern for aspiration pneumonia and a such broad-spectrum antibiotic for given. Patient and his spouse requested transfer to [place] as  this is where they received there are oncology care. Patient discussed with Jake Samples at the [facility] transfer center who informed me that there were no available ICU beds and that the patient will car ICU secondary to being on BiPAP. I then spoke with Dr.Nukulavi the intensivist fellow. Given the fact that there were no available beds at [facility] Dr. Baldwin [place] requested that we observe the patient here in the emergency department and attempt BiPAP weaning when appropriate. Patient was taken off BiPAP for 2 hours following evaluation. Patient had no increased work of breathing at that time. Patient was placed on 4 L nasal cannula and after 2 hours off the BiPAP patient oxygen saturation 100% on 4 L. Work of breathing stable and without difficulty. As such patient was discussed with [place] oncologist on call at [facility] who stated that he would be in the patient's best interest to be observed here at [facility] as the patient would have to be transferred ED to ED and would most likely get to a bed until in the afternoon. I conveyed this to the patient and his wife who are in agreement to stay here at [facility] and a such patient was discussed with Dr.Pyreddy who will admit the patient.      ____________________________________________  FINAL CLINICAL IMPRESSION(S) / ED DIAGNOSES  Final diagnoses:  SOB (shortness of breath)  Aspiration pneumonia of both lower lobes, unspecified aspiration pneumonia type [facility])     MEDICATIONS GIVEN DURING THIS VISIT:  Medications  vancomycin (VANCOCIN) IVPB 1000 mg/200 mL premix (1,000 mg Intravenous New Bag/Given 06/18/16 0027)  piperacillin-tazobactam (ZOSYN) IVPB 3.375 g (0 g Intravenous Stopped 06/18/16 0029)     NEW OUTPATIENT MEDICATIONS STARTED DURING THIS VISIT:  New  Prescriptions   No medications on file    Modified Medications   No medications on file    Discontinued Medications   ASPIRIN EC 81 MG TABLET    Take 81 mg by mouth daily.    DOXYCYCLINE (VIBRAMYCIN) 100 MG CAPSULE    Take 100 mg by mouth daily.     Note:  This document was prepared using Dragon voice recognition software and may include unintentional dictation errors.    Gregor Hams, MD 06/18/16 [phone]

## 2016-06-18 NOTE — ED Notes (Signed)
Pt resting comfortably, states feeling better. Noticeable decrease in WOB

## 2016-06-18 NOTE — ED Notes (Signed)
Pt on 6LNCO2 at this time

## 2016-06-19 ENCOUNTER — Telehealth: Payer: Self-pay | Admitting: *Deleted

## 2016-06-19 ENCOUNTER — Inpatient Hospital Stay: Payer: Commercial Managed Care - HMO

## 2016-06-19 LAB — BASIC METABOLIC PANEL
ANION GAP: 6 (ref 5–15)
BUN: 15 mg/dL (ref 6–20)
CHLORIDE: 87 mmol/L — AB (ref 101–111)
CO2: 37 mmol/L — ABNORMAL HIGH (ref 22–32)
Calcium: 8.2 mg/dL — ABNORMAL LOW (ref 8.9–10.3)
Creatinine, Ser: 0.75 mg/dL (ref 0.61–1.24)
GFR calc Af Amer: >60 mL/min (ref >60)
GLUCOSE: 97 mg/dL (ref 65–99)
POTASSIUM: 3.9 mmol/L (ref 3.5–5.1)
Sodium: 130 mmol/L — ABNORMAL LOW (ref 135–145)

## 2016-06-19 LAB — CBC
HEMATOCRIT: 36.8 % — AB (ref 40.0–52.0)
HEMOGLOBIN: 12.1 g/dL — AB (ref 13.0–18.0)
MCH: 26.6 pg (ref 26.0–34.0)
MCHC: 32.9 g/dL (ref 32.0–36.0)
MCV: 80.8 fL (ref 80.0–100.0)
Platelets: 195 10*3/uL (ref 150–440)
RBC: 4.55 MIL/uL (ref 4.40–5.90)
RDW: 16.8 % — ABNORMAL HIGH (ref 11.5–14.5)
WBC: 13.4 10*3/uL — ABNORMAL HIGH (ref 3.8–10.6)

## 2016-06-19 MED ORDER — AMOXICILLIN-POT CLAVULANATE 875-125 MG PO TABS: 1.0000 | ORAL_TABLET | Freq: Two times a day (BID) | ORAL | 0 refills | Status: DC

## 2016-06-19 MED ORDER — ENSURE ENLIVE PO LIQD: 237.0000 mL | Freq: Three times a day (TID) | ORAL | Status: DC

## 2016-06-19 NOTE — Care Management (Signed)
Discharge to home today per Dr. Manuella Ghazi. No follow-up needs identified. Family will transport. Shelbie Ammons RN MSN CCM Care Management

## 2016-06-19 NOTE — Discharge Instructions (Signed)
Aspiration Pneumonia °Aspiration pneumonia is an infection in your lungs. It occurs when food, liquid, or stomach contents (vomit) are inhaled (aspirated) into your lungs. When these things get into your lungs, swelling (inflammation) and infection can occur. This can make it difficult for you to breathe. Aspiration pneumonia is a serious condition and can be life threatening. °What increases the risk? °Aspiration pneumonia is more likely to occur when a person's cough (gag) reflex or ability to swallow has been decreased. Some things that can do this include: °· Having a brain injury or disease, such as stroke, seizures, Parkinson's disease, dementia, or amyotrophic lateral sclerosis (ALS). °· Being given general anesthetic for procedures. °· Being in a coma (unconscious). °· Having a narrowing of the tube that carries food to the stomach (esophagus). °· Drinking too much alcohol. If a person passes out and vomits, vomit can be swallowed into the lungs. °· Taking certain medicines, such as tranquilizers or sedatives. ° °What are the signs or symptoms? °· Coughing after swallowing food or liquids. °· Breathing problems, such as wheezing or shortness of breath. °· Bluish skin. This can be caused by lack of oxygen. °· Coughing up food or mucus. The mucus might contain blood, greenish material, or yellowish-white fluid (pus). °· Fever. °· Chest pain. °· Being more tired than usual (fatigue). °· Sweating more than usual. °· Bad breath. °How is this diagnosed? °A physical exam will be done. During the exam, the health care provider will listen to your lungs with a stethoscope to check for: °· Crackling sounds in the lungs. °· Decreased breath sounds. °· A rapid heartbeat. ° °Various tests may be ordered. These may include: °· Chest X-ray. °· CT scan. °· Swallowing study. This test looks at how food is swallowed and whether it goes into your breathing tube (trachea) or food pipe (esophagus). °· Sputum culture. Saliva and  mucus (sputum) are collected from the lungs or the tubes that carry air to the lungs (bronchi). The sputum is then tested for bacteria. °· Bronchoscopy. This test uses a flexible tube (bronchoscope) to see inside the lungs. ° °How is this treated? °Treatment will usually include antibiotic medicines. Other medicines may also be used to reduce fever or pain. You may need to be treated in the hospital. In the hospital, your breathing will be carefully monitored. Depending on how well you are breathing, you may need to be given oxygen, or you may need breathing support from a breathing machine (ventilator). For people who fail a swallowing study, a feeding tube might be placed in the stomach, or they may be asked to avoid certain food textures or liquids when they eat. °Follow these instructions at home: °· Carefully follow any special eating instructions you were given, such as avoiding certain food textures or thickening liquids. This reduces the risk of developing aspiration pneumonia again. °· Only take over-the-counter or prescription medicines as directed by your health care provider. Follow the directions carefully. °· If you were prescribed antibiotics, take them as directed. Finish them even if you start to feel better. °· Rest as instructed by your health care provider. °· Keep all follow-up appointments with your health care provider. °Contact a health care provider if: °· You develop worsening shortness of breath, wheezing, or difficulty breathing. °· You develop a fever. °· You have chest pain. °This information is not intended to replace advice given to you by your health care provider. Make sure you discuss any questions you have with your health care   provider. °Document Released: 02/01/2009 Document Revised: 09/18/2015 Document Reviewed: 09/22/2012 °Elsevier Interactive Patient Education © 2017 [employer] ° °

## 2016-06-19 NOTE — Progress Notes (Signed)
Initial Nutrition Assessment  DOCUMENTATION CODES:   Severe malnutrition in context of chronic illness  INTERVENTION:  Provide Ensure Enlive po TID between meals, each supplement provides 350 kcal and 20 grams of protein. Recommended patient try Ensure Plus or Ensure Enlive at home to better meet calorie/protein needs than Ensure Original.  Provided High-Calorie, High-Protein Nutrition Therapy and High-Calorie, High-Protein Recipes Handouts from the Academy of [employer] and Dietetics to patient and mother. Reviewed handouts and patient's calorie and protein needs to prevent further weight loss.   NUTRITION DIAGNOSIS:   Malnutrition (Severe) related to chronic illness (stage IV lung cancer) as evidenced by 22.3 percent weight loss over 6 months, severe depletion of body fat, severe depletion of muscle mass.  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   -Of note patient was unable to receive paracentesis at recent admission at [facility] for his ascites as they were unable to find a safe pocket for procedure. PleurX was placed.  Spoke with patient and his mother at bedside. Patient reports his appetite has been poor for the past few week. He reports he is still eating 2-3 meals per day, but mainly eating soups, grits, fruit, and cereal (with milk). Patient reports occasionally he can have chicken pie, chicken pasta, or chicken and dumplings. Patient reports he also drinks 1-3 Ensure Original per day.  Patient reports abdominal pain in setting of his ascites/distention that causes him to have early satiety.   Patient reports UBW 235 lbs. Per chart patient was 236.4 lbs on 12/30/2015 and has lost 52.7 lbs (22.3% body weight) over approximately 6 months, which is significant for time frame.   Meal Completion: 100% of breakfast this morning (only 175 kcal, 3 grams of protein)  Medications reviewed and include: folic acid 1 mg daily, pantoprazole, prednisone 20 mg daily, senna, NS @ 75 ml/hr (1.8 L/day).  Labs reviewed: Sodium 130, Chloride 87, CO2 37.   Nutrition-Focused physical exam completed. Findings are moderate-severe fat depletion, moderate-severe muscle depletion, and no edema. Abdomen very distended on exam.  Diet Order:  Diet Heart Room service appropriate? Yes; Fluid consistency: Thin Diet - low sodium heart healthy  Skin:  Reviewed, no issues  Last BM:  06/19/2016  Height:   Ht Readings from Last 1 Encounters:  06/18/16 6' (1.829 m)    Weight:   Wt Readings from Last 1 Encounters:  06/18/16 183 lb 11.2 oz (83.3 kg)    Ideal Body Weight:  80.9 kg  BMI:  Body mass index is 24.91 kg/m.  Estimated Nutritional Needs:   Kcal:  2200-2500 (MSJ x 1.3-1.5)  Protein:  100-125 grams (1.2-1.5 grams/kg)  Fluid:  2.5 L/day (30 ml/kg)  EDUCATION NEEDS:   Education needs addressed (High Calorie, High Protein Nutrition Therapy & Recipe Handouts)  Willey Blade, MS, RD, LDN Pager: [phone] After Hours Pager: [phone]

## 2016-06-19 NOTE — Progress Notes (Signed)
Pt is being discharged today, discharge instructions reviewed with patient. He verified understanding. His belongings were packed and returned to patient. He was rolled out in wheelchair by staff.

## 2016-06-19 NOTE — Discharge Summary (Addendum)
[facility] [place] NAME: Robert Morse    MR#:  [id number]  DATE OF BIRTH:  10-16-1965  DATE OF ADMISSION:  06/17/2016   ADMITTING PHYSICIAN: Saundra Shelling, MD  DATE OF DISCHARGE: 06/19/2016  1:45 PM  PRIMARY CARE PHYSICIAN: Tommi Rumps, MD   ADMISSION DIAGNOSIS:  SOB (shortness of breath) [R06.02] Aspiration pneumonia of both lower lobes, unspecified aspiration pneumonia type ([place]) [J69.0] DISCHARGE DIAGNOSIS:  Active Problems:   Pneumonia  SECONDARY DIAGNOSIS:   Past Medical History:  Diagnosis Date  . Atherosclerosis   . Cancer (HCC)    LUNG   . Depression    takes Celexa daily  . GERD (gastroesophageal reflux disease)    takes Omeprazole daily  . History of bronchitis    Aug 2014  . History of gout   . Hyperlipidemia    takes Crestor daily  . Hypertension   . Lung cancer ([place])   . Polyp of colon    Dr. Candace Cruise at [facility]  . Pulmonary embolism ([place])   . Shortness of breath    with exertion  . Sinus infection    takes Augmentin daily  . Sleep apnea    uses a cpap;sleep study done about a yr and a half ago  . Thyroid nodule    small polyp on thyroid   HOSPITAL COURSE:  51 year old male patient with history of stage IV lung cancer, pulmonary embolism on Lovenox injections, gout, hyperlipidemia, thyroid nodule presented to the emergency room with shortness of breath and cough after aspirating while drinking water.  * Acute on chronic hypoxic resp failure  * Acute aspiration event with likely Aspiration pneumonitis/pneumonia: Improved on IV antibiotic and being discharged on oral Augmentin he is feeling much better and is looking forward to go home.  Leukocytosis improved he had been afebrile while in the hospital.  * Hyponatremia: Likely due to dehydration -Improved on IV normal saline.  *Leukocytosis: Likely secondary to steroids DISCHARGE CONDITIONS:  stable CONSULTS OBTAINED:   DRUG ALLERGIES:    Allergies  Allergen Reactions  . Morphine And Related     Passed out   DISCHARGE MEDICATIONS:   Allergies as of 06/19/2016      Reactions   Morphine And Related    Passed out      Medication List    STOP taking these medications   doxycycline 100 MG capsule Commonly known as:  MONODOX   doxycycline 100 MG tablet Commonly known as:  VIBRA-TABS   sulfamethoxazole-trimethoprim 800-160 MG tablet Commonly known as:  BACTRIM DS,SEPTRA DS     TAKE these medications   amLODipine 10 MG tablet Commonly known as:  NORVASC Take 1 tablet (10 mg total) by mouth daily.   amoxicillin-clavulanate 875-125 MG tablet Commonly known as:  AUGMENTIN Take 1 tablet by mouth 2 (two) times daily.   citalopram 20 MG tablet Commonly known as:  CELEXA TAKE 1 TABLET (20 MG TOTAL) BY MOUTH DAILY.   clonazePAM 0.5 MG tablet Commonly known as:  KLONOPIN Take 0.5 mg by mouth 2 (two) times daily.   folic acid 1 MG tablet Commonly known as:  FOLVITE Take 1 mg by mouth daily.   hydrocortisone 2.5 % cream Apply 1 application topically 2 (two) times daily.   lactulose 10 GM/15ML solution Commonly known as:  CHRONULAC Take 30 mLs by mouth daily as needed.   LORazepam 0.5 MG tablet Commonly known as:  ATIVAN  Take 0.5 mg by mouth 3 (three) times daily as needed for anxiety.   magic mouthwash Soln Take 5 mLs by mouth 4 (four) times daily as needed for mouth pain.   OxyCODONE ER 9 MG C12a Take 18 mg by mouth 2 (two) times daily.   Oxycodone HCl 10 MG Tabs Take 10 mg by mouth every 4 (four) hours as needed for pain.   pantoprazole 40 MG tablet Commonly known as:  PROTONIX Take 40 mg by mouth daily.   predniSONE 10 MG tablet Commonly known as:  DELTASONE Take 10 mg by mouth as directed. 3 tablets twice daily x 5 days (total 7 days) then decrease to 2 tablets twice daily x 7 days, 1 tablet twice daily x 7 days, 1 tablet daily x 7 days, 1/2 tablet dialy x 7 days then stop   rosuvastatin  20 MG tablet Commonly known as:  CRESTOR Take 1 tablet (20 mg total) by mouth daily.   senna-docusate 8.6-50 MG tablet Commonly known as:  Senokot-S Take 2 tablets by mouth 2 (two) times daily.   simethicone 80 MG chewable tablet Commonly known as:  MYLICON Chew 80 mg by mouth every 6 (six) hours as needed for flatulence.   TAGRISSO 80 MG tablet Generic drug:  osimertinib mesylate Take 80 mg by mouth daily.   valACYclovir 500 MG tablet Commonly known as:  VALTREX Take 1 tablet (500 mg total) by mouth daily as needed.      DISCHARGE INSTRUCTIONS:   DIET:  Regular diet DISCHARGE CONDITION:  Good ACTIVITY:  Activity as tolerated OXYGEN:  Home Oxygen: Yes.    Oxygen Delivery: 4 liters/min via Patient connected to nasal cannula oxygen DISCHARGE LOCATION:  home   If you experience worsening of your admission symptoms, develop shortness of breath, life threatening emergency, suicidal or homicidal thoughts you must seek medical attention immediately by calling 911 or calling your MD immediately  if symptoms less severe.  You Must read complete instructions/literature along with all the possible adverse reactions/side effects for all the Medicines you take and that have been prescribed to you. Take any new Medicines after you have completely understood and accpet all the possible adverse reactions/side effects.   Please note  You were cared for by a hospitalist during your hospital stay. If you have any questions about your discharge medications or the care you received while you were in the hospital after you are discharged, you can call the unit and asked to speak with the hospitalist on call if the hospitalist that took care of you is not available. Once you are discharged, your primary care physician will handle any further medical issues. Please note that NO REFILLS for any discharge medications will be authorized once you are discharged, as it is imperative that you return to  your primary care physician (or establish a relationship with a primary care physician if you do not have one) for your aftercare needs so that they can reassess your need for medications and monitor your lab values.    On the day of Discharge:  VITAL SIGNS:  Blood pressure (!) 136/93, pulse 96, temperature 97.7 F (36.5 C), temperature source Oral, resp. rate 19, height 6' (1.829 m), weight 83.3 kg (183 lb 11.2 oz), SpO2 100 %. PHYSICAL EXAMINATION:  GENERAL:  51 y.o.-year-old patient lying in the bed with no acute distress.  EYES: Pupils equal, round, reactive to light and accommodation. No scleral icterus. Extraocular muscles intact.  HEENT: Head atraumatic, normocephalic. Oropharynx  and nasopharynx clear.  NECK:  Supple, no jugular venous distention. No thyroid enlargement, no tenderness.  LUNGS: Normal breath sounds bilaterally, no wheezing, rales,rhonchi or crepitation. No use of accessory muscles of respiration.  CARDIOVASCULAR: S1, S2 normal. No murmurs, rubs, or gallops.  ABDOMEN: Soft, non-tender, non-distended. Bowel sounds present. No organomegaly or mass.  EXTREMITIES: No pedal edema, cyanosis, or clubbing.  NEUROLOGIC: Cranial nerves II through XII are intact. Muscle strength 5/5 in all extremities. Sensation intact. Gait not checked.  PSYCHIATRIC: The patient is alert and oriented x 3.  SKIN: No obvious rash, lesion, or ulcer.  DATA REVIEW:   CBC  Recent Labs Lab 06/19/16 0358  WBC 13.4*  HGB 12.1*  HCT 36.8*  PLT 195    Chemistries   Recent Labs Lab 06/17/16 2335  06/19/16 0358  NA 129*  --  130*  K 3.7  --  3.9  CL 87*  --  87*  CO2 31  --  37*  GLUCOSE 164*  --  97  BUN 14  --  15  CREATININE 0.86  < > 0.75  CALCIUM 8.4*  --  8.2*  AST 28  --   --   ALT 42  --   --   ALKPHOS 257*  --   --   BILITOT 0.4  --   --   < > = values in this interval not displayed.   Follow-up Information    Tommi Rumps, MD. Go on 06/23/2016.   Specialty:  Family  Medicine Why:  '@11'$ :30 AM Contact information: [address] [place] [address] [phone]            Management plans discussed with the patient, family and they are in agreement.  CODE STATUS: Full Code   TOTAL TIME TAKING CARE OF THIS PATIENT: 45 minutes.    Max Sane M.D on 06/19/2016 at 2:22 PM  Between 7am to 6pm - Pager - [phone]  After 6pm go to [email or web address] - Proofreader  [facility]  Office  [phone]  CC: Primary care physician; Tommi Rumps, MD   Note: This dictation was prepared with Dragon dictation along with smaller phrase technology. Any transcriptional errors that result from this process are unintentional.

## 2016-06-19 NOTE — Telephone Encounter (Signed)
Patient will discharge from [facility] on 06/19/16. Pt has been scheduled with Dr. Caryl Bis

## 2016-06-22 ENCOUNTER — Other Ambulatory Visit: Payer: Self-pay

## 2016-06-22 LAB — CULTURE, BLOOD (ROUTINE X 2)
CULTURE: NO GROWTH
Culture: NO GROWTH

## 2016-06-22 MED ORDER — ROSUVASTATIN CALCIUM 20 MG PO TABS: 20.0000 mg | ORAL_TABLET | Freq: Every day | ORAL | 3 refills | Status: DC

## 2016-06-22 NOTE — Telephone Encounter (Signed)
First, attempt for TCM call made today ,left message for patient to return call to office will continue to try and reach patient.

## 2016-06-22 NOTE — Telephone Encounter (Signed)
Last filled by Dr.Walker 05/31/15 90 [id number]

## 2016-06-23 ENCOUNTER — Ambulatory Visit: Payer: 59 | Admitting: Family Medicine

## 2016-06-23 NOTE — Telephone Encounter (Signed)
second attempt to TCM was made patient did not answer phone. No further attempt can be made . We can not TCM. FYI

## 2016-06-24 ENCOUNTER — Inpatient Hospital Stay
Admission: EM | Admit: 2016-06-24 | Discharge: 2016-06-25 | DRG: 180 | Disposition: A | Discharge status: Hospice | Payer: Commercial Managed Care - HMO | Attending: Pulmonary Disease | Admitting: Pulmonary Disease

## 2016-06-24 ENCOUNTER — Emergency Department: Payer: Commercial Managed Care - HMO

## 2016-06-24 ENCOUNTER — Inpatient Hospital Stay: Payer: Commercial Managed Care - HMO

## 2016-06-24 DIAGNOSIS — R180 Malignant ascites: Secondary | ICD-10-CM

## 2016-06-24 DIAGNOSIS — C799 Secondary malignant neoplasm of unspecified site: Secondary | ICD-10-CM | POA: Diagnosis not present

## 2016-06-24 DIAGNOSIS — R0602 Shortness of breath: Secondary | ICD-10-CM | POA: Diagnosis present

## 2016-06-24 DIAGNOSIS — C7989 Secondary malignant neoplasm of other specified sites: Secondary | ICD-10-CM | POA: Diagnosis present

## 2016-06-24 DIAGNOSIS — R938 Abnormal findings on diagnostic imaging of other specified body structures: Secondary | ICD-10-CM

## 2016-06-24 DIAGNOSIS — F419 Anxiety disorder, unspecified: Secondary | ICD-10-CM | POA: Diagnosis present

## 2016-06-24 DIAGNOSIS — J942 Hemothorax: Secondary | ICD-10-CM | POA: Diagnosis present

## 2016-06-24 DIAGNOSIS — R188 Other ascites: Secondary | ICD-10-CM | POA: Diagnosis present

## 2016-06-24 DIAGNOSIS — Z833 Family history of diabetes mellitus: Secondary | ICD-10-CM

## 2016-06-24 DIAGNOSIS — K219 Gastro-esophageal reflux disease without esophagitis: Secondary | ICD-10-CM | POA: Diagnosis present

## 2016-06-24 DIAGNOSIS — E871 Hypo-osmolality and hyponatremia: Secondary | ICD-10-CM | POA: Diagnosis present

## 2016-06-24 DIAGNOSIS — Z86010 Personal history of colonic polyps: Secondary | ICD-10-CM

## 2016-06-24 DIAGNOSIS — J9690 Respiratory failure, unspecified, unspecified whether with hypoxia or hypercapnia: Secondary | ICD-10-CM | POA: Diagnosis present

## 2016-06-24 DIAGNOSIS — Z66 Do not resuscitate: Secondary | ICD-10-CM | POA: Diagnosis present

## 2016-06-24 DIAGNOSIS — E785 Hyperlipidemia, unspecified: Secondary | ICD-10-CM | POA: Diagnosis present

## 2016-06-24 DIAGNOSIS — J90 Pleural effusion, not elsewhere classified: Secondary | ICD-10-CM | POA: Diagnosis present

## 2016-06-24 DIAGNOSIS — C786 Secondary malignant neoplasm of retroperitoneum and peritoneum: Secondary | ICD-10-CM | POA: Diagnosis present

## 2016-06-24 DIAGNOSIS — Z86711 Personal history of pulmonary embolism: Secondary | ICD-10-CM | POA: Diagnosis not present

## 2016-06-24 DIAGNOSIS — Z79899 Other long term (current) drug therapy: Secondary | ICD-10-CM

## 2016-06-24 DIAGNOSIS — Z7952 Long term (current) use of systemic steroids: Secondary | ICD-10-CM | POA: Diagnosis not present

## 2016-06-24 DIAGNOSIS — I10 Essential (primary) hypertension: Secondary | ICD-10-CM | POA: Diagnosis present

## 2016-06-24 DIAGNOSIS — J9620 Acute and chronic respiratory failure, unspecified whether with hypoxia or hypercapnia: Secondary | ICD-10-CM | POA: Diagnosis present

## 2016-06-24 DIAGNOSIS — G4730 Sleep apnea, unspecified: Secondary | ICD-10-CM | POA: Diagnosis present

## 2016-06-24 DIAGNOSIS — J9621 Acute and chronic respiratory failure with hypoxia: Secondary | ICD-10-CM | POA: Diagnosis not present

## 2016-06-24 DIAGNOSIS — C3490 Malignant neoplasm of unspecified part of unspecified bronchus or lung: Secondary | ICD-10-CM | POA: Diagnosis present

## 2016-06-24 DIAGNOSIS — Z8249 Family history of ischemic heart disease and other diseases of the circulatory system: Secondary | ICD-10-CM | POA: Diagnosis not present

## 2016-06-24 DIAGNOSIS — Z885 Allergy status to narcotic agent status: Secondary | ICD-10-CM

## 2016-06-24 DIAGNOSIS — J9692 Respiratory failure, unspecified with hypercapnia: Secondary | ICD-10-CM

## 2016-06-24 LAB — BLOOD GAS, VENOUS
ACID-BASE EXCESS: 3.9 mmol/L — AB (ref 0.0–2.0)
BICARBONATE: 30.9 mmol/L — AB (ref 20.0–28.0)
O2 SAT: 88.2 %
PATIENT TEMPERATURE: 37
PH VEN: 7.35 (ref 7.250–7.430)
pCO2, Ven: 56 mmHg (ref 44.0–60.0)
pO2, Ven: 58 mmHg — ABNORMAL HIGH (ref 32.0–45.0)

## 2016-06-24 LAB — BASIC METABOLIC PANEL
Anion gap: 15 (ref 5–15)
BUN: 22 mg/dL — AB (ref 6–20)
CALCIUM: 8.8 mg/dL — AB (ref 8.9–10.3)
CHLORIDE: 80 mmol/L — AB (ref 101–111)
CO2: 28 mmol/L (ref 22–32)
CREATININE: 0.84 mg/dL (ref 0.61–1.24)
GFR calc Af Amer: >60 mL/min (ref >60)
GFR calc non Af Amer: >60 mL/min (ref >60)
Glucose, Bld: 142 mg/dL — ABNORMAL HIGH (ref 65–99)
Potassium: 4.5 mmol/L (ref 3.5–5.1)
Sodium: 123 mmol/L — ABNORMAL LOW (ref 135–145)

## 2016-06-24 LAB — CBC
HCT: 36.2 % — ABNORMAL LOW (ref 40.0–52.0)
Hemoglobin: 12 g/dL — ABNORMAL LOW (ref 13.0–18.0)
MCH: 26.4 pg (ref 26.0–34.0)
MCHC: 33.2 g/dL (ref 32.0–36.0)
MCV: 79.4 fL — AB (ref 80.0–100.0)
PLATELETS: 272 10*3/uL (ref 150–440)
RBC: 4.56 MIL/uL (ref 4.40–5.90)
RDW: 17.5 % — ABNORMAL HIGH (ref 11.5–14.5)
WBC: 15.3 10*3/uL — ABNORMAL HIGH (ref 3.8–10.6)

## 2016-06-24 LAB — BRAIN NATRIURETIC PEPTIDE: B Natriuretic Peptide: 10 pg/mL (ref 0.0–100.0)

## 2016-06-24 LAB — GLUCOSE, CAPILLARY: GLUCOSE-CAPILLARY: 116 mg/dL — AB (ref 65–99)

## 2016-06-24 LAB — MRSA PCR SCREENING: MRSA by PCR: NEGATIVE

## 2016-06-24 MED ORDER — LORAZEPAM 2 MG/ML IJ SOLN
INTRAMUSCULAR | Status: AC
  Administered 2016-06-24: 1 mg via INTRAVENOUS
  Filled 2016-06-24: qty 1

## 2016-06-24 MED ORDER — LORAZEPAM 2 MG/ML IJ SOLN
1.0000 mg | Freq: Once | INTRAMUSCULAR | Status: AC
  Administered 2016-06-24: 1 mg via INTRAVENOUS

## 2016-06-24 MED ORDER — OXYCODONE HCL ER 10 MG PO T12A: 20.0000 mg | EXTENDED_RELEASE_TABLET | Freq: Two times a day (BID) | ORAL | Status: DC

## 2016-06-24 MED ORDER — CLONAZEPAM 0.5 MG PO TABS
0.5000 mg | ORAL_TABLET | Freq: Two times a day (BID) | ORAL | Status: DC
  Administered 2016-06-24 – 2016-06-25 (×3): 0.5 mg via ORAL
  Filled 2016-06-24 (×3): qty 1

## 2016-06-24 MED ORDER — FENTANYL CITRATE (PF) 100 MCG/2ML IJ SOLN
25.0000 ug | INTRAMUSCULAR | Status: DC | PRN
  Administered 2016-06-24: 50 ug via INTRAVENOUS
  Filled 2016-06-24: qty 2

## 2016-06-24 MED ORDER — IPRATROPIUM-ALBUTEROL 0.5-2.5 (3) MG/3ML IN SOLN
3.0000 mL | Freq: Once | RESPIRATORY_TRACT | Status: AC
  Administered 2016-06-24: 3 mL via RESPIRATORY_TRACT
  Filled 2016-06-24: qty 3

## 2016-06-24 MED ORDER — OXYCODONE HCL ER 10 MG PO T12A
20.0000 mg | EXTENDED_RELEASE_TABLET | Freq: Two times a day (BID) | ORAL | Status: DC
  Administered 2016-06-24 – 2016-06-25 (×3): 20 mg via ORAL
  Filled 2016-06-24 (×3): qty 2

## 2016-06-24 MED ORDER — ONDANSETRON HCL 4 MG/2ML IJ SOLN: 4.0000 mg | Freq: Four times a day (QID) | INTRAMUSCULAR | Status: DC | PRN

## 2016-06-24 MED ORDER — ONDANSETRON HCL 4 MG/2ML IJ SOLN
INTRAMUSCULAR | Status: AC
  Administered 2016-06-24: 4 mg
  Filled 2016-06-24: qty 2

## 2016-06-24 MED ORDER — ALBUTEROL SULFATE (2.5 MG/3ML) 0.083% IN NEBU
5.0000 mg | INHALATION_SOLUTION | Freq: Once | RESPIRATORY_TRACT | Status: AC
  Administered 2016-06-24: 5 mg via RESPIRATORY_TRACT
  Filled 2016-06-24: qty 6

## 2016-06-24 MED ORDER — CITALOPRAM HYDROBROMIDE 20 MG PO TABS
20.0000 mg | ORAL_TABLET | Freq: Every day | ORAL | Status: DC
  Administered 2016-06-24 – 2016-06-25 (×2): 20 mg via ORAL
  Filled 2016-06-24 (×2): qty 1

## 2016-06-24 MED ORDER — PANTOPRAZOLE SODIUM 40 MG PO TBEC
40.0000 mg | DELAYED_RELEASE_TABLET | Freq: Every day | ORAL | Status: DC
  Administered 2016-06-24 – 2016-06-25 (×2): 40 mg via ORAL
  Filled 2016-06-24 (×2): qty 1

## 2016-06-24 MED ORDER — PREDNISONE 20 MG PO TABS: 20.0000 mg | ORAL_TABLET | Freq: Two times a day (BID) | ORAL | Status: DC

## 2016-06-24 MED ORDER — ENOXAPARIN SODIUM 100 MG/ML ~~LOC~~ SOLN
1.0000 mg/kg | Freq: Two times a day (BID) | SUBCUTANEOUS | Status: DC
  Administered 2016-06-24 – 2016-06-25 (×2): 85 mg via SUBCUTANEOUS
  Filled 2016-06-24 (×2): qty 1

## 2016-06-24 MED ORDER — METHYLPREDNISOLONE SODIUM SUCC 125 MG IJ SOLR
125.0000 mg | Freq: Once | INTRAMUSCULAR | Status: AC
  Administered 2016-06-24: 125 mg via INTRAVENOUS
  Filled 2016-06-24: qty 2

## 2016-06-24 MED ORDER — OSIMERTINIB MESYLATE 80 MG PO TABS
80.0000 mg | ORAL_TABLET | Freq: Every day | ORAL | Status: DC
  Administered 2016-06-24: 80 mg via ORAL

## 2016-06-24 MED ORDER — OXYCODONE HCL 5 MG PO TABS
10.0000 mg | ORAL_TABLET | ORAL | Status: DC | PRN
  Administered 2016-06-24 – 2016-06-25 (×5): 10 mg via ORAL
  Filled 2016-06-24 (×5): qty 2

## 2016-06-24 MED ORDER — PREDNISONE 20 MG PO TABS
20.0000 mg | ORAL_TABLET | Freq: Two times a day (BID) | ORAL | Status: DC
  Administered 2016-06-24 – 2016-06-25 (×3): 20 mg via ORAL
  Filled 2016-06-24 (×3): qty 1

## 2016-06-24 MED ORDER — ENOXAPARIN SODIUM 100 MG/ML ~~LOC~~ SOLN: 90.0000 mg | Freq: Two times a day (BID) | SUBCUTANEOUS | Status: DC

## 2016-06-24 MED ORDER — LACTULOSE 10 GM/15ML PO SOLN: 20.0000 g | Freq: Every day | ORAL | Status: DC | PRN

## 2016-06-24 NOTE — Care Management (Addendum)
Spoke with Santiago Glad with [facility] hospice regarding DME (IV Pump and hospital bed). She states that [facility] will need to arrange [facility]. [facility] cannot provide CAD pump . [facility] [phone] fax [phone] can supply hospital bed/nebulizer. Rx requested from MD. Per Dr. Alva Garnet optional Nebulizer Morphine (unsure of needed dose).  Roxanol SL another option. No true allergy per Dr. Alva Garnet and he will remove from allergy list tomorrow.

## 2016-06-24 NOTE — Progress Notes (Signed)
Paracenitis done at bedside by IR, spouse at bedside aware, pt also with no concerns. VS wnl during procedure. No ss of bleeding noted.

## 2016-06-24 NOTE — Care Management Note (Addendum)
Case Management Note  Patient Details  Name: Robert Morse MRN: [id number] Date of Birth: 1966/01/21  Subjective/Objective:                   Met with patient, his wife and daughter after progression rounds with physician. MD has asked that I present hospice agencies to patient. Patient would like to use [facility]. He states he will need EMS to home. He has also requested hospital bed. He has home O2 set up in the home.  Action/Plan: Referral to karen with hospice. Requested O2 and hospital bed also from them. EMS packet created for tomorrow's discharge (per patient/family request).  No DNR transport forms on unit- Unit clerk going to get some. Need MD signature for transport.  Expected Discharge Date:                  Expected Discharge Plan:     In-House Referral:     Discharge planning Services     Post Acute Care Choice:    Choice offered to:     DME Arranged:    DME Agency:     HH Arranged:    HH Agency:     Status of Service:     If discussed at [employer], dates discussed:    Additional Comments:  Marshell Garfinkel, RN 06/24/2016, 10:45 AM

## 2016-06-24 NOTE — ED Provider Notes (Signed)
[facility] Emergency Department Provider Note   ____________________________________________   First MD Initiated Contact with Patient 06/24/16 [phone]     (approximate)  I have reviewed the triage vital signs and the nursing notes.   HISTORY  Chief Complaint Shortness of Breath  History Limited by patient respiratory distress  HPI Robert Morse is a 51 y.o. male who comes into the hospital today with shortness of breath. It started this morning when he woke up. The patient has a history of stage IV lung cancer and he is currently taking oral chemotherapy. The patient's wife reports that the last time he had these symptoms he had an aspiration pneumonia. He was seen and admitted to the hospital approximately one week ago with similar symptoms. The patient has a left sided drain that his wife reports has been changing in color from orange to red to dark purple. She reports that she is also been increasing fluid coming from the drain from 350 ML's to 500 ML's per day. The patient does not use any inhalers but he does take prednisone. His doctor today increased his prednisone from 10 mg twice a day to 20 mg and in attempt to help with his shortness of breath. The patient is here tonight for evaluation. He is a full code at this time.The patient has not had any fever or new cough or any other symptoms today leading to this.   Past Medical History:  Diagnosis Date  . Atherosclerosis   . Cancer (HCC)    LUNG   . Depression    takes Celexa daily  . GERD (gastroesophageal reflux disease)    takes Omeprazole daily  . History of bronchitis    Aug 2014  . History of gout   . Hyperlipidemia    takes Crestor daily  . Hypertension   . Lung cancer ([place])   . Polyp of colon    Dr. Candace Cruise at [place]  . Pulmonary embolism ([place])   . Shortness of breath    with exertion  . Sinus infection    takes Augmentin daily  . Sleep apnea    uses a cpap;sleep study done about a  yr and a half ago  . Thyroid nodule    small polyp on thyroid    Patient Active Problem List   Diagnosis Date Noted  . Respiratory failure ([place]) 06/24/2016  . Pneumonia 06/18/2016  . Anxiety 12/30/2015  . Essential hypertension 05/31/2015  . Elevated liver enzymes 04/02/2015  . Diarrhea 04/01/2015  . Malignant neoplasm of unspecified part of right bronchus or lung ([place]) 04/26/2014  . Atherosclerosis 07/20/2013  . Routine general medical examination at a health care facility 07/14/2013  . Posterior neck pain 07/14/2013  . Neuropathic pain 06/19/2013  . Dyspnea 05/17/2013  . Coronary artery disease 05/02/2013  . Gout flare 12/28/2011  . Hyperlipidemia 10/29/2011  . Obesity (BMI 30-39.9) 10/29/2011  . Herpes genitalis 10/29/2011    Past Surgical History:  Procedure Laterality Date  . CARDIAC CATHETERIZATION  11/2007   [facility]: Normal coronary arteries.  . COLONOSCOPY    . ESOPHAGOGASTRODUODENOSCOPY    . LUNG CANCER SURGERY  1.28.15  . MENISCUS REPAIR  2012   left knee, [facility] Judi Cong at [facility]  . NASAL SEPTUM SURGERY  7-81yr ago   Dr. CCarlis Abbott . TONSILLECTOMY AND ADENOIDECTOMY  1973   adenoids as a child    Prior to Admission medications   Medication Sig Start Date End Date Taking? Authorizing Provider  amLODipine (NORVASC) 10 MG tablet Take 1 tablet (10 mg total) by mouth daily. 05/21/16  Yes Leone Haven, MD  amoxicillin-clavulanate (AUGMENTIN) 875-125 MG tablet Take 1 tablet by mouth 2 (two) times daily. 06/19/16  Yes Vipul Manuella Ghazi, MD  citalopram (CELEXA) 20 MG tablet TAKE 1 TABLET (20 MG TOTAL) BY MOUTH DAILY. 06/18/16  Yes Leone Haven, MD  clonazePAM (KLONOPIN) 0.5 MG tablet Take 0.5 mg by mouth 2 (two) times daily.   Yes Historical Provider, MD  enoxaparin (LOVENOX) 100 MG/ML injection Inject 90 mg into the skin 2 (two) times daily. 06/09/16  Yes Historical Provider, MD  lactulose (CHRONULAC) 10 GM/15ML solution Take 30 mLs by mouth daily as needed.   Yes  Historical Provider, MD  Oxycodone HCl 10 MG TABS Take 10 mg by mouth every 4 (four) hours as needed.   Yes Historical Provider, MD  Oxycodone HCl 20 MG TABS Take 20 mg by mouth every 12 (twelve) hours.   Yes Historical Provider, MD  pantoprazole (PROTONIX) 40 MG tablet Take 40 mg by mouth daily.   Yes Historical Provider, MD  predniSONE (DELTASONE) 10 MG tablet Take 10 mg by mouth as directed. 3 tablets twice daily x 5 days (total 7 days) then decrease to 2 tablets twice daily x 7 days, 1 tablet twice daily x 7 days, 1 tablet daily x 7 days, 1/2 tablet dialy x 7 days then stop   Yes Historical Provider, MD  senna-docusate (SENOKOT-S) 8.6-50 MG tablet Take 2 tablets by mouth 2 (two) times daily.   Yes Historical Provider, MD  simethicone (MYLICON) 80 MG chewable tablet Chew 80 mg by mouth every 6 (six) hours as needed for flatulence.   Yes Historical Provider, MD  TAGRISSO 80 MG tablet Take 80 mg by mouth daily.   Yes Historical Provider, MD  rosuvastatin (CRESTOR) 20 MG tablet Take 1 tablet (20 mg total) by mouth daily. Patient not taking: Reported on 06/24/2016 06/22/16   Leone Haven, MD  valACYclovir (VALTREX) 500 MG tablet Take 1 tablet (500 mg total) by mouth daily as needed. Patient not taking: Reported on 06/24/2016 05/31/15   Jackolyn Confer, MD    Allergies Morphine and related  Family History  Problem Relation Age of Onset  . Heart disease Father     s/p CABG  . Hypertension Father   . Diabetes Father   . Hyperlipidemia Father   . Heart attack Father   . Hyperlipidemia Maternal Grandfather   . Heart disease Maternal Grandfather   . Hypertension Maternal Grandfather   . Heart disease Maternal Uncle   . Healthy Mother   . Hyperlipidemia Maternal Grandmother   . Hypertension Maternal Grandmother   . Cancer Other     cancer  . Diabetes Other   . Heart disease Paternal Uncle   . Heart disease Paternal Grandfather     Social History Social History  Substance Use Topics    . Smoking status: Never Smoker  . Smokeless tobacco: Never Used  . Alcohol use No    Review of Systems Constitutional: No fever/chills Eyes: No visual changes. ENT: No sore throat. Cardiovascular: Denies chest pain. Respiratory:  shortness of breath. Gastrointestinal: No abdominal pain.  No nausea, no vomiting.  No diarrhea.  No constipation. Genitourinary: Negative for dysuria. Musculoskeletal: Negative for back pain. Skin: Negative for rash. Neurological: Negative for headaches, focal weakness or numbness.  10-point ROS otherwise negative.  ____________________________________________   PHYSICAL EXAM:  VITAL SIGNS: ED Triage Vitals  Enc Vitals Group     BP 06/24/16 0700 130/83     Pulse Rate 06/24/16 0700 94     Resp 06/24/16 0700 20     Temp 06/24/16 0711 97.6 F (36.4 C)     Temp Source 06/24/16 0711 Axillary     SpO2 06/24/16 0638 98 %     Weight 06/24/16 0636 185 lb (83.9 kg)     Height 06/24/16 0636 6' (1.829 m)     Head Circumference --      Peak Flow --      Pain Score --      Pain Loc --      Pain Edu? --      Excl. in [place]? --     Constitutional: Alert and oriented. Well appearing and in severe respiratory distress. Eyes: Conjunctivae are normal. PERRL. EOMI. Head: Atraumatic. Nose: No congestion/rhinnorhea. Mouth/Throat: Mucous membranes are moist.  Oropharynx non-erythematous. Cardiovascular: Normal rate, regular rhythm. Grossly normal heart sounds.  Good peripheral circulation. Respiratory: increased respiratory effort.   retractions. Worse breath sounds and expiratory wheezes left greater than right Gastrointestinal: Soft and nontender. No distention. Positive bowel sounds Musculoskeletal: No lower extremity tenderness nor edema.  Neurologic:  Normal speech and language. N Skin:  Skin is warm, dry and intact.  Psychiatric: Mood and affect are normal.  ____________________________________________   LABS (all labs ordered are listed, but only  abnormal results are displayed)  Labs Reviewed  BLOOD GAS, VENOUS - Abnormal; Notable for the following:       Result Value   pO2, Ven 58.0 (*)    Bicarbonate 30.9 (*)    Acid-Base Excess 3.9 (*)    All other components within normal limits  BASIC METABOLIC PANEL - Abnormal; Notable for the following:    Sodium 123 (*)    Chloride 80 (*)    Glucose, Bld 142 (*)    BUN 22 (*)    Calcium 8.8 (*)    All other components within normal limits  CBC - Abnormal; Notable for the following:    WBC 15.3 (*)    Hemoglobin 12.0 (*)    HCT 36.2 (*)    MCV 79.4 (*)    RDW 17.5 (*)    All other components within normal limits  BRAIN NATRIURETIC PEPTIDE   ____________________________________________  EKG  ED ECG REPORT I, Loney Hering, the attending physician, personally viewed and interpreted this ECG.   Date: 06/24/2016  EKG Time: 646  Rate: 123  Rhythm: sinus tachycardia  Axis: normal  Intervals:none  ST&T Change: none  ____________________________________________  RADIOLOGY  CXR ____________________________________________   PROCEDURES  Procedure(s) performed: None  Procedures  Critical Care performed: Yes, see critical care note(s)   CRITICAL CARE Performed by: Charlesetta Ivory P   Total critical care time: 30 minutes  Critical care time was exclusive of separately billable procedures and treating other patients.  Critical care was necessary to treat or prevent imminent or life-threatening deterioration.  Critical care was time spent personally by me on the following activities: development of treatment plan with patient and/or surrogate as well as nursing, discussions with consultants, evaluation of patient's response to treatment, examination of patient, obtaining history from patient or surrogate, ordering and performing treatments and interventions, ordering and review of laboratory studies, ordering and review of radiographic studies, pulse oximetry  and re-evaluation of patient's condition.  ____________________________________________   INITIAL IMPRESSION / ASSESSMENT AND PLAN / ED COURSE  Pertinent labs & imaging  results that were available during my care of the patient were reviewed by me and considered in my medical decision making (see chart for details).  This is a 51 year old male who has a history of stage IV lung cancer who comes into the hospital today with some significant respiratory distress. The patient does not have any inhalers at home but because of his respiratory distress he was placed on BiPAP. I did give the patient a breathing treatment and his blood work is unremarkable. The patient's chest x-ray shows an increased right-sided pleural effusion and he does have some hyponatremia. I will give the patient a 500 mL bolus of normal saline and some Solu-Medrol. He will be admitted to the intensive care unit service for further evaluation of his increasing effusion.  Clinical Course as of Jun 25 831  Wed Jun 24, 2016  0746 Enlarging right-sided pleural effusion DG Chest [facility] [AW]    Clinical Course User Index [AW] Loney Hering, MD     ____________________________________________   FINAL CLINICAL IMPRESSION(S) / ED DIAGNOSES  Final diagnoses:  Pleural effusion  Hyponatremia  Shortness of breath      NEW MEDICATIONS STARTED DURING THIS VISIT:  New Prescriptions   No medications on file     Note:  This document was prepared using Dragon voice recognition software and may include unintentional dictation errors.    Loney Hering, MD 06/24/16 [phone]

## 2016-06-24 NOTE — H&P (Signed)
PULMONARY / CRITICAL CARE MEDICINE   Name: Robert Morse MRN: [id number] DOB: April 18, 1966    ADMISSION DATE:  06/24/2016    PT PROFLIE:   51 year old never smoker with adenocarcinoma of the lung initially diagnosed 3 years ago, status post resection of right lower lobe and right middle lobe, recurrence, failure of chemotherapy, now with widespread metastases throughout the thorax and abdomen. He presented with acute worsening of baseline dyspnea  HISTORY OF PRESENT ILLNESS:   As above. He awoke with a smothering sensation. He denies fevers chills or sweats. He has no new pains. He denies hemoptysis. When he presented to the emergency department, he was noted to be in distress and BiPAP was initiated. He was admitted 02/28-03/02 to the hospitalists service for similar complaints. A chest x-ray at that time revealed increasing right pleural effusion. He was discharged home on Augmentin. His cancer care has been at [facility]. His oncologist is Dr. Marcie Mowers.  PAST MEDICAL HISTORY :  He  has a past medical history of Atherosclerosis; Cancer ([place]); Depression; GERD (gastroesophageal reflux disease); History of bronchitis; History of gout; Hyperlipidemia; Hypertension; Lung cancer ([place]); Polyp of colon; Pulmonary embolism ([place]); Shortness of breath; Sinus infection; Sleep apnea; and Thyroid nodule.  PAST SURGICAL HISTORY: He  has a past surgical history that includes Nasal septum surgery (7-68yr ago); Meniscus repair (2012); Tonsillectomy and adenoidectomy (1973); Colonoscopy; Esophagogastroduodenoscopy; Cardiac catheterization (11/2007); and Lung cancer surgery (1.28.15).  Allergies  Allergen Reactions  . Morphine And Related     Passed out    No current facility-administered medications on file prior to encounter.    Current Outpatient Prescriptions on File Prior to Encounter  Medication Sig  . amLODipine (NORVASC) 10 MG tablet Take 1 tablet (10 mg total) by mouth daily.  .Marland Kitchen amoxicillin-clavulanate (AUGMENTIN) 875-125 MG tablet Take 1 tablet by mouth 2 (two) times daily.  . citalopram (CELEXA) 20 MG tablet TAKE 1 TABLET (20 MG TOTAL) BY MOUTH DAILY.  . clonazePAM (KLONOPIN) 0.5 MG tablet Take 0.5 mg by mouth 2 (two) times daily.  .Marland Kitchenlactulose (CHRONULAC) 10 GM/15ML solution Take 30 mLs by mouth daily as needed.  . pantoprazole (PROTONIX) 40 MG tablet Take 40 mg by mouth daily.  . predniSONE (DELTASONE) 10 MG tablet Take 10 mg by mouth as directed. 3 tablets twice daily x 5 days (total 7 days) then decrease to 2 tablets twice daily x 7 days, 1 tablet twice daily x 7 days, 1 tablet daily x 7 days, 1/2 tablet dialy x 7 days then stop  . senna-docusate (SENOKOT-S) 8.6-50 MG tablet Take 2 tablets by mouth 2 (two) times daily.  . simethicone (MYLICON) 80 MG chewable tablet Chew 80 mg by mouth every 6 (six) hours as needed for flatulence.  .Marland KitchenTAGRISSO 80 MG tablet Take 80 mg by mouth daily.  . rosuvastatin (CRESTOR) 20 MG tablet Take 1 tablet (20 mg total) by mouth daily. (Patient not taking: Reported on 06/24/2016)  . valACYclovir (VALTREX) 500 MG tablet Take 1 tablet (500 mg total) by mouth daily as needed. (Patient not taking: Reported on 06/24/2016)    FAMILY HISTORY:  His indicated that his mother is alive. He indicated that his father is alive. He indicated that his sister is alive. He indicated that the status of his maternal grandmother is unknown. He indicated that his maternal grandfather is deceased. He indicated that the status of his paternal grandfather is unknown. He indicated that his maternal uncle is alive. He indicated that  the status of his paternal uncle is unknown. He indicated that the status of his other is unknown.    SOCIAL HISTORY: He  reports that he has never smoked. He has never used smokeless tobacco. He reports that he does not drink alcohol or use drugs.  REVIEW OF SYSTEMS:   Except for as listed in the history of present illness, review of  systems is negative.  SUBJECTIVE:    VITAL SIGNS: BP (!) 101/92 (BP Location: Right Arm)   Pulse (!) 115   Temp 97.8 F (36.6 C) (Oral)   Resp 20   Ht 6' (1.829 m)   Wt 185 lb (83.9 kg)   SpO2 94%   BMI 25.09 kg/m   HEMODYNAMICS:    VENTILATOR SETTINGS: FiO2 (%):  [70 %] 70 %  INTAKE / OUTPUT: No intake/output data recorded.  PHYSICAL EXAMINATION: General:  Cognition intact, respiratory status stable on BiPAP, chronically ill-appearing Neuro:  No focal deficits HEENT:  Mild temporal wasting, otherwise normal Cardiovascular: Regular without murmurs Lungs: Breath sounds essentially absent throughout on the right, no wheezes heard Abdomen: Moderately distended and dull to percussion throughout, nontender Extremities:Warm without edema  LABS:  BMET  Recent Labs Lab 06/17/16 2335 06/18/16 0829 06/19/16 0358 06/24/16 0648  NA 129*  --  130* 123*  K 3.7  --  3.9 4.5  CL 87*  --  87* 80*  CO2 31  --  37* 28  BUN 14  --  15 22*  CREATININE 0.86 0.94 0.75 0.84  GLUCOSE 164*  --  97 142*    Electrolytes  Recent Labs Lab 06/17/16 2335 06/19/16 0358 06/24/16 0648  CALCIUM 8.4* 8.2* 8.8*    CBC  Recent Labs Lab 06/18/16 0829 06/19/16 0358 06/24/16 0648  WBC 14.0* 13.4* 15.3*  HGB 12.8* 12.1* 12.0*  HCT 37.3* 36.8* 36.2*  PLT 216 195 272    Coag's No results for input(s): APTT, INR in the last 168 hours.  Sepsis Markers  Recent Labs Lab 06/17/16 2350  LATICACIDVEN 1.4    ABG No results for input(s): PHART, PCO2ART, PO2ART in the last 168 hours.  Liver Enzymes  Recent Labs Lab 06/17/16 2335  AST 28  ALT 42  ALKPHOS 257*  BILITOT 0.4  ALBUMIN 2.8*    Cardiac Enzymes No results for input(s): TROPONINI, PROBNP in the last 168 hours.  Glucose  Recent Labs Lab 06/24/16 0909  GLUCAP 116*    Imaging Dg Chest Port 1 View  Result Date: 06/24/2016 CLINICAL DATA:  History of lung carcinoma with shortness of Breath EXAM: PORTABLE  CHEST 1 VIEW COMPARISON:  06/19/2016 FINDINGS: Cardiac shadow is stable in appearance. Small left pleural effusion is again seen. Increasing opacity in the right hemithorax is noted consistent with an enlarging effusion. No acute bony abnormality is noted. IMPRESSION: Enlarging right-sided pleural effusion. Electronically Signed   By: Inez Catalina M.D.   On: 06/24/2016 07:19     ASSESSMENT / PLAN: Acute on chronic respiratory failure Widely metastatic adenocarcinoma of the lung Progressive opacification of the right hemithorax Exam consistent with significant ascites-history of extensive peritoneal carcinomatosis  I spoke at length with the patient and his family regarding goals of care. He wishes to be DO NOT RESUSCITATE/DO NOT INTUBATE. His wish is to be made sufficiently comfortable that he can go back home with hospice. We discussed options of evaluation and possible therapeutic interventions that might provide some relief of dyspnea. These included the possibility of thoracentesis on the right  and the possibility of paracentesis.  I discussed the radiographic findings with Dr. Kathlene Cote of interventional radiology. We agree that it is not clear how much pleural fluid is present on the right. We agree that we might get more therapeutic benefit from paracentesis.   I discussed his care with Dr. Marcie Mowers at [facility] the plan of care as outlined here has been approved by Dr. Sharlet Salina. Dr. Sharlet Salina agrees to be his hospice physician of record after discharge  We will proceed with paracentesis with the hopes that this will relieve some work of breathing. Our goal is to get him discharged on the day following this evaluation. He will need supplemental oxygen and will be continued on his current medical regimen. I will look into whether hospice can administer intravenous medications as needed for comfort. If so, we will consider PICC line placement   CCM time: 35 mins The above time  includes time spent in consultation with patient and/or family members and reviewing care plan on multidisciplinary rounds  Merton Border, MD PCCM service Mobile [phone] Pager [phone]       06/24/2016, 1:56 PM

## 2016-06-24 NOTE — ED Notes (Signed)
Pt's wife reports pt "had about 3/4ths of RIGHT lung removed." Pt has hx of stage 4 lung cancer.

## 2016-06-24 NOTE — ED Notes (Signed)
ED Provider at bedside. 

## 2016-06-24 NOTE — Progress Notes (Signed)
Pt. Was transported from ED to CCU while on the bipap.

## 2016-06-24 NOTE — ED Notes (Addendum)
EDP in room with respiratory therapist. EDP order for pt to be placed on Bipap at this time, respiratory applying mask to pt.

## 2016-06-24 NOTE — ED Notes (Signed)
Pt's family requesting to take morning medications; informed family that it would be best to wait until he gets to ICU to make sure the medications are suited for his care while here. Wife vocalized understanding and agrees to wait.

## 2016-06-24 NOTE — ED Triage Notes (Addendum)
Per EMS, pt has Stage 4 lung cancer and woke up this am with [facility]. EMS reports when they arrrived his O2 was in the high 80's/low 90's. Pt was placed on CPAP however pt was not able to tolerate so pt placed on non rebreather where his saturation improved to 98%. EMS also reports Hospice was coming to pt's home today. Pt A&O at this time.

## 2016-06-24 NOTE — Progress Notes (Signed)
New referral for [facility] services at home following discharge received from [facility]. Mr. Robert Morse is a 51 year old man with stage IV lung Ca now with wide spread metastases through out the thorax and abdomen. He was admitted to [facility] today for treatment of worsening dyspnea, requiring Bipap. Patient and his family met with attending physician Dr. Alva Garnet and have chosen to focus on comfort with support of hospice services. Patient to have a bedside paracentesis today, with possible discharge home tomorrow. Patient will require and hospital bed and oxygen at discharge. CMRN Angela aware, hospital CM to arrange DME as patient has [place] [facility].  Patient seen lying in bed asleep, Bipap in place. He has required one dose of oxycodone 10 mg today, OxyContin 20 mg  q 12  due tonight. Clonazepam 0.5 mg scheduled BID, given prior to visit. Wife Robert Morse and daughter present at bedside. Education initiated regarding hospice services, philosophy and team approach to care with good understanding voiced. Writer discussed pain/dyspnea medication with Robert Morse, she reports that patient has been taking the oxycodone 10 mg every 4 hours, patient has not been taking the OxyContin 20 mg as their insurance does not cover it, they have been paying for the oxycodone IR 10 mg out of pocket as this is not covered either.  Writer asked about the patient's morphine allergy, Robert Morse reported that he had IV morphine one time and "it dropped his blood pressure". Writer explained that this is a side effect of any narcotic, especially when given IV. She voiced her understanding and is open to using MS Contin and MSIR or Liquid morphine (roxanol) for breakthrough. A fentanyl patch could also be considered if the insurance will pay for it, again with liquid morphine for breakthrough. She would like for her husband to have a dose at the hospital prior to discharge to monitor for side effects and effectiveness. Writer to  discuss with attending physician in the morning.  Plan per Robert Morse is for patient to discharge home on a nasal cannula. He is currently back and forth between the Bipap and a cannula. Patient remains asleep through out the visit, he appeared to be resting comfortably. Patient information faxed to referral. Will continue to follow and coordinate discharge with hospital care team. Hospice information and contact number given to [facility].Thank you. Flo Shanks RN, BSN, [place] [facility] [place], [facility] [phone] c

## 2016-06-24 NOTE — Progress Notes (Signed)
Dr Alva Garnet has spent extensive amount of time talking with family regarding care and goals, family and patient agrees on palliative and supportive care at home, pt is switching back and forth btwn BIPAP and nasal cannula, pain meds given as needed.Pt verbalizes breathing is better at this time. Report given to US IR before doing paracentesis at bedside. VS wnl at this time. No ss of distress or concerns noted.

## 2016-06-24 NOTE — ED Notes (Signed)
RT called to assist with transport. She will be here when she finishes what she is doing on 2A.

## 2016-06-25 MED ORDER — MORPHINE SULFATE (CONCENTRATE) 10 MG/0.5ML PO SOLN: 10.0000 mg | ORAL | Status: DC

## 2016-06-25 MED ORDER — PREDNISONE 20 MG PO TABS: 20.0000 mg | ORAL_TABLET | Freq: Two times a day (BID) | ORAL | 0 refills | Status: AC

## 2016-06-25 MED ORDER — FENTANYL 12 MCG/HR TD PT72
25.0000 ug | MEDICATED_PATCH | TRANSDERMAL | Status: DC
  Administered 2016-06-25: 25 ug via TRANSDERMAL
  Filled 2016-06-25: qty 2

## 2016-06-25 MED ORDER — CLONAZEPAM 0.5 MG PO TABS
1.0000 mg | ORAL_TABLET | Freq: Two times a day (BID) | ORAL | Status: DC
Start: 2016-06-25 — End: 2016-06-25

## 2016-06-25 MED ORDER — FENTANYL 25 MCG/HR TD PT72: 25.0000 ug | MEDICATED_PATCH | TRANSDERMAL | 0 refills | Status: AC

## 2016-06-25 MED ORDER — ALPRAZOLAM 0.5 MG PO TABS
0.5000 mg | ORAL_TABLET | Freq: Three times a day (TID) | ORAL | Status: DC | PRN
  Administered 2016-06-25 (×2): 0.5 mg via ORAL
  Filled 2016-06-25 (×2): qty 1

## 2016-06-25 MED ORDER — CLONAZEPAM 1 MG PO TABS: 1.0000 mg | ORAL_TABLET | Freq: Two times a day (BID) | ORAL | 0 refills | Status: AC

## 2016-06-25 NOTE — Care Management (Signed)
Patient has Acute on chronic respiratory failure secondary to adenocarcinoma which requires torso to be positioned in ways not feasible with a normal bed. Head must be elevated at least 30 degrees.  If head of bed not elevated dyspnea occurs.

## 2016-06-25 NOTE — Progress Notes (Signed)
Pt in no respiratory distress, taken off bipap and placed on 6lpm [place], sats 97%, resipratory rate 20/min, will continue to monitor.

## 2016-06-25 NOTE — Progress Notes (Signed)
Follow up visit made to new referral for [facility] services at home. Patient seen sitting up in bed, now on nasal cannula at 6 liters, alert and able to converse, dyspnea noted with any exertion. Patient's wife Maudie Mercury present, reviewed hospice services. Hospital bed has been ordered, due to patient's insurance DME is arranged by the hospital care manager, [place] aware, hospital bed is being ordered. Patient stated "I am good in my recliner" and wishes to discharge prior to bed being delivered, wife Maudie Mercury in agreement. Fentanyl patch has been added at 25 mcg for management of pain/dyspnea. Patient confirmed the need for EMS transport. CMRN Colletta [place] made aware. Discharge summary faxed to referral. Thank you. Flo Shanks RN, BSN, [place] and [facility] [place], [facility] [phone] c

## 2016-06-25 NOTE — Discharge Summary (Addendum)
Physician Discharge Summary  Patient ID: Robert Morse MRN: [id number] DOB/AGE: 1965-10-20 51 y.o.  Admit date: 06/24/2016 Discharge date: 06/25/2016    Discharge Diagnoses:        Acute on chronic respiratory failure secondary to adenocarcinoma of the lung       Widely Stage IV lung adenocarcinoma with widespread metastases throughout the thorax and        abdomen        Progressive opacification of the right hemothorax       Significant ascites-hx of extensive peritoneal carcinomatosis              DISCHARGE PLAN BY DIAGNOSIS    Most recent office visit note on 06/23/2016 completed by pts Oncologist Dr. Sharlet Salina at [facility] states there would be a lack of benefit from additional treatment of Stage IV lung adenocarcinoma with wide spread metastases throughout the thorax and abdomen, and there would be a risk of significant side effects and rapid decline with additional chemotherapy treatment.  Therefore, after further discussion with pt and pts wife the goal at this point is to optimize his pain medication and to focus on pt comfort, pt will be discharged with hospice services. Dr. Sharlet Salina the pts Oncologist agrees to be his hospice physician of record after discharge. He will continue Oxycontin 20 mg twice a day, Oxycodone 10 mg every 4 hrs, Prednisone 20 mg twice a day for 10 days, and increase Clonazepam dose to 1 mg twice a day for anxiety.  For better pain control will add Fentanyl Patch 25 mcg every 72 hours.  He will also be discharged with home O2 for dyspnea, and a hospital bed.                  DISCHARGE SUMMARY   Robert Morse is a 50 y.o. y/o male with a PMH of never smoker with adenocarcinoma of the lung initially diagnosed 3 years ago, status post resection of right lower lobe and right middle lobe, recurrence, failure of chemotherapy, now with widespread metastases throughout the thorax and abdomen. He presented to the ER 03/7 with acute worsening of baseline dyspnea  requiring continuous Bipap.  He was recently admitted to the hospitalists services between 02/28-03/02 for similar complaints and cxr revealed increasing right pleural effusion at that time he was discharged on Augmentin. He had a paracentesis performed 03/7 this hospitalization with removal of 2.0 liters of blood tinged peritoneal fluid to relieve increased work of breathing.  He will be discharged home with hospice services, Dr. Sharlet Salina who is his Oncologist at [facility] agrees to be his hospice physician of record after discharge.  SIGNIFICANT DIAGNOSTIC STUDIES US Paracentesis 03/7>>Successful ultrasound-guided paracentesis yielding 2.0 liters of blood tinged peritoneal fluid.  SIGNIFICANT EVENTS 03/7-Pt admitted with acute on chronic respiratory failure secondary to adenocarcinoma of the lung with metastatic disease requiring continuous Bipap  MICRO DATA  MRSA PCR 03/7>>negative   ANTIBIOTICS None  CONSULTS Intensivist  Hospice   TUBES / LINES None   Discharge Exam: General: Chronically ill appearing Caucasian male, resting in bed Neuro: A&O x 3, non-focal.  HEENT: [place]/AT. PERRL, sclerae anicteric. Cardiovascular: Sinus tach, RRR, no M/R/G.  Lungs: Diminished throughout, respirations even and unlabored.   Abdomen: BS x 4, soft, mildly tender and distended Musculoskeletal: No gross deformities, no edema.  Skin: Intact, warm, no rashes.   Vitals:   06/25/16 1415 06/25/16 1500 06/25/16 1600 06/25/16 1700  BP:  (!) 126/95 137/89 (!) 139/92  Pulse: (!) 103 97 100 97  Resp: (!) '22 11 12 12  '$ Temp:      TempSrc:      SpO2: 90% 95% 95% 97%  Weight:      Height:         Discharge Labs  BMET  Recent Labs Lab 06/19/16 0358 06/24/16 0648  NA 130* 123*  K 3.9 4.5  CL 87* 80*  CO2 37* 28  GLUCOSE 97 142*  BUN 15 22*  CREATININE 0.75 0.84  CALCIUM 8.2* 8.8*    CBC  Recent Labs Lab 06/19/16 0358 06/24/16 0648  HGB 12.1* 12.0*  HCT 36.8* 36.2*  WBC  13.4* 15.3*  PLT 195 272    Anti-Coagulation No results for input(s): INR in the last 168 hours.  Discharge Instructions    Increase activity slowly    Complete by:  As directed           Allergies as of 06/25/2016      Reactions   Morphine And Related    Passed out      Medication List    STOP taking these medications   amoxicillin-clavulanate 875-125 MG tablet Commonly known as:  AUGMENTIN   rosuvastatin 20 MG tablet Commonly known as:  CRESTOR     TAKE these medications   amLODipine 10 MG tablet Commonly known as:  NORVASC Take 1 tablet (10 mg total) by mouth daily.   citalopram 20 MG tablet Commonly known as:  CELEXA TAKE 1 TABLET (20 MG TOTAL) BY MOUTH DAILY.   clonazePAM 1 MG tablet Commonly known as:  KLONOPIN Take 1 tablet (1 mg total) by mouth 2 (two) times daily. What changed:  medication strength  how much to take   enoxaparin 100 MG/ML injection Commonly known as:  LOVENOX Inject 90 mg into the skin 2 (two) times daily.   fentaNYL 25 MCG/HR patch Commonly known as:  DURAGESIC - dosed mcg/hr Place 1 patch (25 mcg total) onto the skin every 3 (three) days.   lactulose 10 GM/15ML solution Commonly known as:  CHRONULAC Take 30 mLs by mouth daily as needed.   Oxycodone HCl 10 MG Tabs Take 10 mg by mouth every 4 (four) hours as needed. What changed:  Another medication with the same name was removed. Continue taking this medication, and follow the directions you see here.   oxyCODONE 20 mg 12 hr tablet Commonly known as:  OXYCONTIN Take 20 mg by mouth every 12 (twelve) hours. What changed:  Another medication with the same name was removed. Continue taking this medication, and follow the directions you see here.   pantoprazole 40 MG tablet Commonly known as:  PROTONIX Take 40 mg by mouth daily.   predniSONE 20 MG tablet Commonly known as:  DELTASONE Take 1 tablet (20 mg total) by mouth 2 (two) times daily with a meal. What  changed:  medication strength  how much to take  when to take this  additional instructions   senna-docusate 8.6-50 MG tablet Commonly known as:  Senokot-S Take 2 tablets by mouth 2 (two) times daily.   simethicone 80 MG chewable tablet Commonly known as:  MYLICON Chew 80 mg by mouth every 6 (six) hours as needed for flatulence.   TAGRISSO 80 MG tablet Generic drug:  osimertinib mesylate Take 80 mg by mouth daily.   valACYclovir 500 MG tablet Commonly known as:  VALTREX Take 1 tablet (500 mg total) by mouth daily as needed.  Durable Medical Equipment        Start     Ordered   06/25/16 1514  For home use only DME oxygen  Once    Question Answer Comment  Mode or (Route) Nasal cannula   Liters per Minute 6   Frequency Continuous (stationary and portable oxygen unit needed)   Oxygen delivery system Gas      06/25/16 1514   06/24/16 1547  For home use only DME Hospital bed  Once    Question Answer Comment  The above medical condition requires: Patient requires the ability to reposition immediately   Head must be elevated greater than: 30 degrees   Bed type Semi-electric      06/24/16 1547       Disposition: Pt discharge home with hospice.  Discharged Condition: Robert Morse will be discharged home with hospice with the focus on comfort.  Marda Stalker, [place] Pager [phone] (please enter 7 digits) [place] Pager [phone] (please enter 7 digits)      PCCM ATTENDING: I have reviewed the above discharge plan and have participated extensively in making discharge arrangements. I agree with above. Approximately 45 minutes of my time was required to arrange for this discharge. This included consultation with hospice services as well as care management.  Merton Border, MD PCCM service Mobile [phone] Pager [phone] 06/25/2016

## 2016-06-25 NOTE — Progress Notes (Signed)
Prior authorization completed per [employer] request for Fentanyl Patch 25 mcg/hr every 72 hours.  All requested paperwork faxed to [phone] per [employer] request for approval of Fentanyl Patch prescription.    Marda Stalker, [place] Pager [phone] (please enter 7 digits) PCCM Consult Pager [phone] (please enter 7 digits)

## 2016-06-25 NOTE — Progress Notes (Signed)
DME has been arranged: Hospital bed and 10 liter oxygen concentrator to be delivered by 7 pm. Per discussion with Maudie Mercury, they would like to wait until DME is delivered prior to leaving the hospital tonight. Staff RN Lovena Le and [facility] [place] updated. Hospice on call RN made aware. Thank you. Flo Shanks RN, BSN, [place] and [facility] [place], [facility] [phone] c

## 2016-06-25 NOTE — Care Management (Signed)
Patient to discharge home today.  Patient will discharge with hospice services through [place].  Hospital Bed order signed and faxed to [facility].  Per [facility] patient will have to insurance authorization prior to delivery.  Patient would like to return home prior to delivery.   Patient has been requiring 6 liters of O2 to maintain saturations.  Patient's wife states that concentrator at home only goes up to 4 liters. Patient's home O2 [facility].  Contacted [facility] to pursue obtaining a concentrator with higher liter flow.  Per Almyra Free with billing [facility] would no longer be able to provide patient's O2, due to patient transitioning to Hospice.  RNCM explained to Almyra Free that per [facility] would still pay DME benefit.  Almyra Free states that [facility] will still be unable to provide the O2, and will need to pick up their equipment from the home.    [facility] to supply home O2 at discharge.  Order has been written for 6 liters.  Santiago Glad from [facility] has notified Ivin Booty with [facility].   Patient to transport home via EMS after O2 delivered at the home.  RNCM signing off

## 2016-06-26 ENCOUNTER — Ambulatory Visit: Payer: 59 | Admitting: Family Medicine

## 2016-07-19 DEATH — deceased

## 2017-12-18 IMAGING — DX DG CHEST 1V PORT
1 series · 1 of 1 positions shown · non-contrast
Comparison: 06/19/2016

CLINICAL DATA: History of lung carcinoma with shortness of Breath

EXAM:
PORTABLE CHEST 1 VIEW

[chest ap]
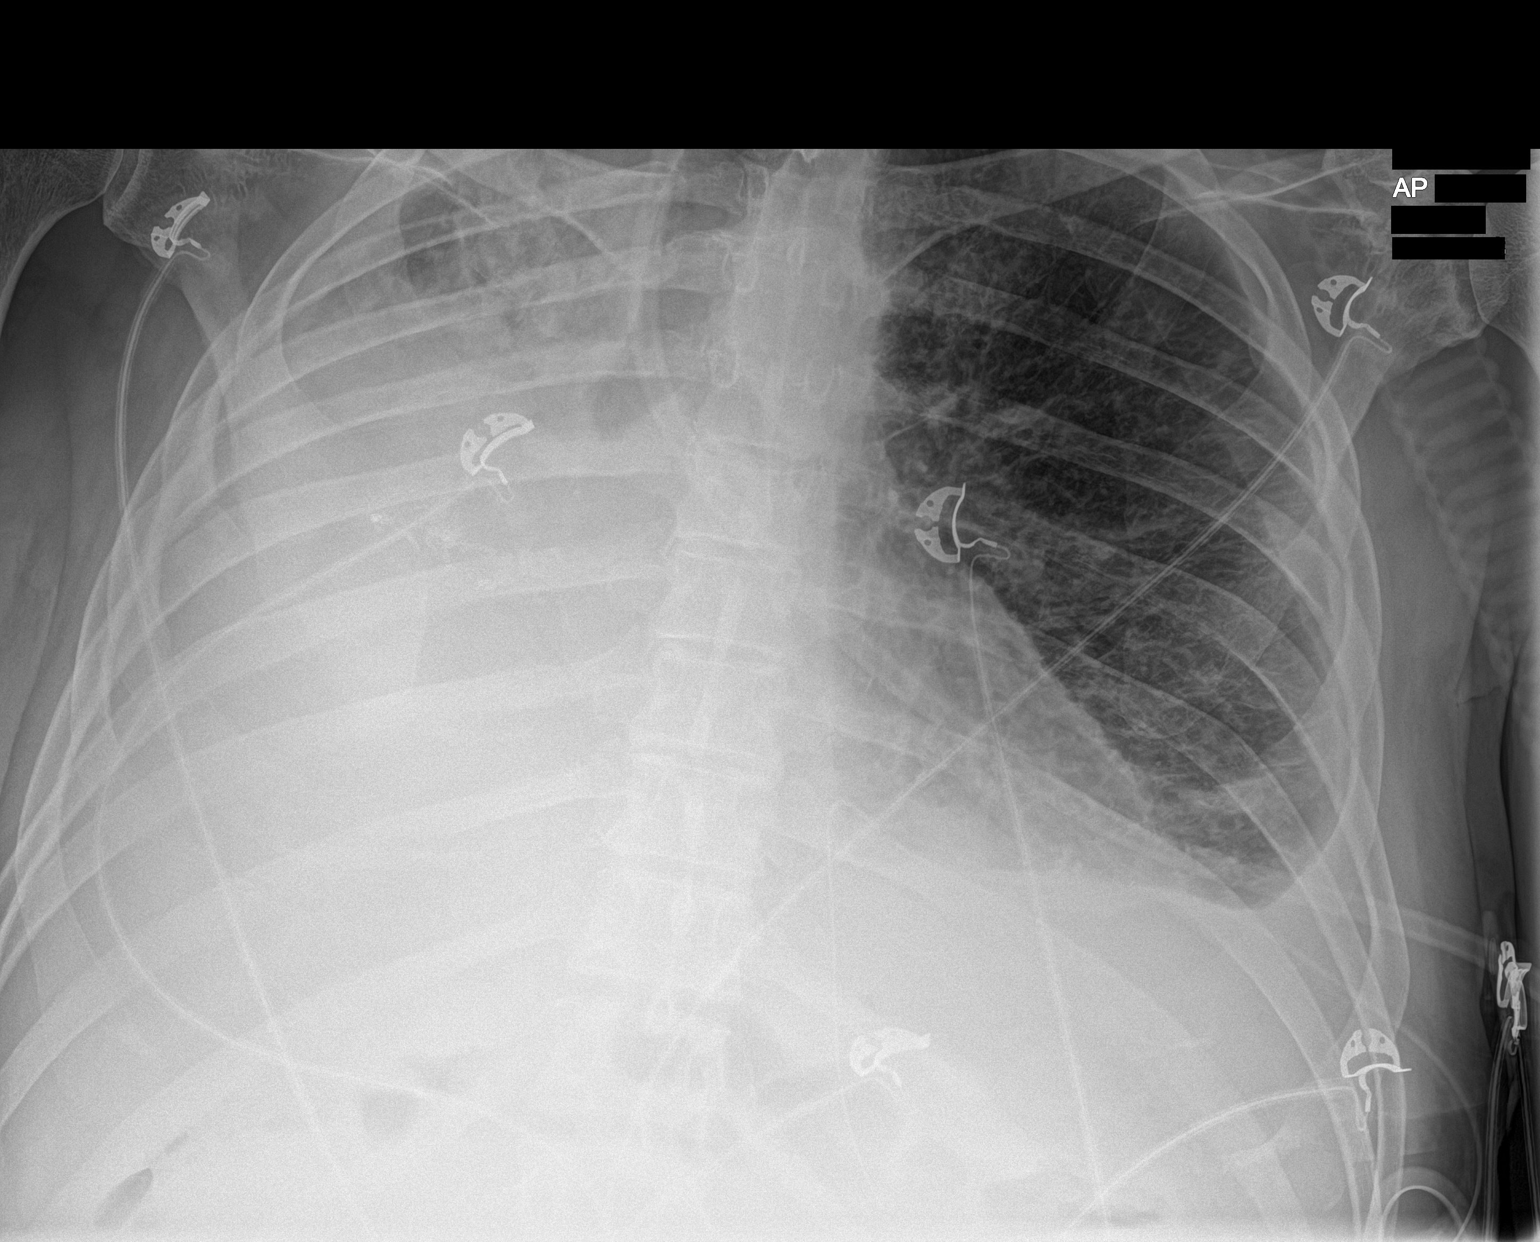

[1 of 1 positions shown; findings below may reference images not displayed]

FINDINGS: Cardiac shadow is stable in appearance. Small left pleural effusion
is again seen. Increasing opacity in the right hemithorax is noted
consistent with an enlarging effusion. No acute bony abnormality is
noted.
IMPRESSION: Enlarging right-sided pleural effusion.
